# Patient Record
Sex: Male | Born: 1985 | Race: White | Hispanic: No | Marital: Single | Smoking: Never smoker
Health system: Southern US, Community
[De-identification: ages and names within clinical notes are randomized; demographics above are authoritative.]

## PROBLEM LIST (undated history)

## (undated) DIAGNOSIS — F988 Other specified behavioral and emotional disorders with onset usually occurring in childhood and adolescence: Secondary | ICD-10-CM

## (undated) DIAGNOSIS — E785 Hyperlipidemia, unspecified: Secondary | ICD-10-CM

## (undated) DIAGNOSIS — R030 Elevated blood-pressure reading, without diagnosis of hypertension: Secondary | ICD-10-CM

## (undated) DIAGNOSIS — Z148 Genetic carrier of other disease: Secondary | ICD-10-CM

## (undated) DIAGNOSIS — F419 Anxiety disorder, unspecified: Secondary | ICD-10-CM

## (undated) HISTORY — DX: Genetic carrier of other disease: Z14.8

## (undated) HISTORY — DX: Other specified behavioral and emotional disorders with onset usually occurring in childhood and adolescence: F98.8

## (undated) HISTORY — DX: Elevated blood-pressure reading, without diagnosis of hypertension: R03.0

## (undated) HISTORY — DX: Hyperlipidemia, unspecified: E78.5

## (undated) HISTORY — DX: Anxiety disorder, unspecified: F41.9

---

## 2008-06-15 ENCOUNTER — Emergency Department: Payer: Self-pay | Admitting: Emergency Medicine
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
## 2009-06-14 ENCOUNTER — Ambulatory Visit: Payer: Self-pay | Admitting: Internal Medicine

## 2019-06-01 ENCOUNTER — Ambulatory Visit: Payer: Self-pay | Admitting: Internal Medicine

## 2019-07-26 DIAGNOSIS — Z20828 Contact with and (suspected) exposure to other viral communicable diseases: Secondary | ICD-10-CM | POA: Diagnosis not present

## 2019-07-27 ENCOUNTER — Telehealth: Payer: Self-pay | Admitting: Internal Medicine

## 2019-07-27 NOTE — Telephone Encounter (Signed)
Spoke with Anthony Mora.  Said his sister's boyfried tested positive for covid-19.  He was around his sister.  Anthony Mora went to [facility] yesterday for covid-19 test.  Anthony Mora was & still is asymptomatic.   He thought he was seeing results in his [employer], but after talking with him, he was only seeing where the test was done. Got him to sign-up for [facility] mychart & link it to his [facility] account.  Michela Pitcher he will keep checking for results & will call us back when he get his results.  AMD

## 2019-07-27 NOTE — Telephone Encounter (Signed)
I think it is probably ok for him to return Thursday, but I would recommend he call the health department and get guidance as well and have them confirm ok to return and not a need to quarantine for longer time given the exposure.

## 2019-07-27 NOTE — Telephone Encounter (Signed)
Spoke with  Anthony Mora & gave him the [facility] Covid phone number [phone]) to get their guidance & input.  Then call us back tomorrow & let us know what they say.  Anthony Mora called them the [facility] & they will still there.  Nurse advised him to stay out of work until 08/05/2019.  AMD

## 2019-07-27 NOTE — Telephone Encounter (Signed)
His test results came back today Neg. He needs to know what his next step will be. No one called him he saw his results on mychart. His next work day is Thursday.

## 2019-07-27 NOTE — Telephone Encounter (Signed)
Gene called back stating his covid-19 results came up & it's "not detected".  States he's not having symptoms.  Contact was with his sister & she's had a covid test & hers is negative.  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

## 2019-07-28 NOTE — Telephone Encounter (Signed)
Contacted Anthony Mora & advised him to let HR know he's out of work until 08/05/2019.  AMD

## 2019-07-28 NOTE — Telephone Encounter (Signed)
Please complete paperwork for this with return to work 9/10 and have him call and let HR know as well to keep them informed.  If he has any symptoms arise, needs to follow-up by phone as well.

## 2019-08-25 ENCOUNTER — Ambulatory Visit: Payer: Self-pay

## 2019-08-25 DIAGNOSIS — Z23 Encounter for immunization: Secondary | ICD-10-CM

## 2019-09-09 ENCOUNTER — Other Ambulatory Visit: Payer: Self-pay

## 2019-09-09 ENCOUNTER — Ambulatory Visit: Payer: Self-pay

## 2019-09-09 DIAGNOSIS — Z0000 Encounter for general adult medical examination without abnormal findings: Secondary | ICD-10-CM

## 2019-09-10 LAB — CMP12+LP+TP+TSH+6AC+PSA+CBC?
ALT: 18 IU/L (ref 0–44)
AST: 20 IU/L (ref 0–40)
Albumin/Globulin Ratio: 1.9 (ref 1.2–2.2)
BUN/Creatinine Ratio: 10 (ref 9–20)
Calcium: 9.4 mg/dL (ref 8.7–10.2)
Chloride: 101 mmol/L (ref 96–106)
Chol/HDL Ratio: 5.1 ratio — ABNORMAL HIGH (ref 0.0–5.0)
Free Thyroxine Index: 1.8 (ref 1.2–4.9)
Hemoglobin: 17.2 g/dL (ref 13.0–17.7)
Immature Grans (Abs): 0 10*3/uL (ref 0.0–0.1)
Iron: 91 ug/dL (ref 38–169)
Lymphs: 28 %
MCH: 33 pg (ref 26.6–33.0)
MCV: 95 fL (ref 79–97)
Monocytes Absolute: 0.6 10*3/uL (ref 0.1–0.9)
Platelets: 307 10*3/uL (ref 150–450)
Potassium: 4.6 mmol/L (ref 3.5–5.2)
Prostate Specific Ag, Serum: 1.1 ng/mL (ref 0.0–4.0)
RBC: 5.22 x10E6/uL (ref 4.14–5.80)
RDW: 11.8 % (ref 11.6–15.4)
Sodium: 139 mmol/L (ref 134–144)
Triglycerides: 167 mg/dL — ABNORMAL HIGH (ref 0–149)
WBC: 7.7 10*3/uL (ref 3.4–10.8)

## 2019-09-10 LAB — POCT URINALYSIS DIPSTICK
Bilirubin, UA: NEGATIVE
Blood, UA: NEGATIVE
Glucose, UA: NEGATIVE
Ketones, UA: NEGATIVE
Leukocytes, UA: NEGATIVE
Nitrite, UA: NEGATIVE
Protein, UA: NEGATIVE
Spec Grav, UA: 1.02 (ref 1.010–1.025)
Urobilinogen, UA: 0.2 E.U./dL
pH, UA: 6 (ref 5.0–8.0)

## 2019-09-10 LAB — CMP12+LP+TP+TSH+6AC+PSA+CBC…
Albumin: 4.7 g/dL (ref 4.1–5.2)
Alkaline Phosphatase: 94 IU/L (ref 39–117)
BUN: 10 mg/dL (ref 6–20)
Basophils Absolute: 0 10*3/uL (ref 0.0–0.2)
Basos: 1 %
Bilirubin Total: 0.3 mg/dL (ref 0.0–1.2)
Cholesterol, Total: 215 mg/dL — ABNORMAL HIGH (ref 100–199)
Creatinine, Ser: 1.05 mg/dL (ref 0.76–1.27)
EOS (ABSOLUTE): 0.1 10*3/uL (ref 0.0–0.4)
Eos: 1 %
Estimated CHD Risk: 1.1 times avg. — ABNORMAL HIGH (ref 0.0–1.0)
GFR calc Af Amer: 110 mL/min/{1.73_m2} (ref >59)
GFR calc non Af Amer: 95 mL/min/{1.73_m2} (ref >59)
GGT: 20 IU/L (ref 0–65)
Globulin, Total: 2.5 g/dL (ref 1.5–4.5)
Glucose: 77 mg/dL (ref 65–99)
HDL: 42 mg/dL (ref >39)
Hematocrit: 49.5 % (ref 37.5–51.0)
Immature Granulocytes: 0 %
LDH: 150 IU/L (ref 121–224)
LDL Chol Calc (NIH): 143 mg/dL — ABNORMAL HIGH (ref 0–99)
Lymphocytes Absolute: 2.1 10*3/uL (ref 0.7–3.1)
MCHC: 34.7 g/dL (ref 31.5–35.7)
Monocytes: 8 %
Neutrophils Absolute: 4.8 10*3/uL (ref 1.4–7.0)
Neutrophils: 62 %
Phosphorus: 3.3 mg/dL (ref 2.8–4.1)
T3 Uptake Ratio: 27 % (ref 24–39)
T4, Total: 6.6 ug/dL (ref 4.5–12.0)
TSH: 1.22 u[IU]/mL (ref 0.450–4.500)
Total Protein: 7.2 g/dL (ref 6.0–8.5)
Uric Acid: 6.3 mg/dL (ref 3.7–8.6)
VLDL Cholesterol Cal: 30 mg/dL (ref 5–40)

## 2019-09-21 ENCOUNTER — Ambulatory Visit: Payer: Self-pay | Admitting: Occupational Medicine

## 2019-09-21 ENCOUNTER — Encounter: Payer: Self-pay | Admitting: Occupational Medicine

## 2019-09-21 ENCOUNTER — Other Ambulatory Visit: Payer: Self-pay

## 2019-09-21 VITALS — BP 120/90 | HR 94 | Temp 98.1°F | Resp 12 | Ht 71.0 in | Wt 162.0 lb

## 2019-09-21 DIAGNOSIS — Z0000 Encounter for general adult medical examination without abnormal findings: Secondary | ICD-10-CM

## 2019-11-29 DIAGNOSIS — Z20822 Contact with and (suspected) exposure to covid-19: Secondary | ICD-10-CM | POA: Diagnosis not present

## 2019-11-29 DIAGNOSIS — J209 Acute bronchitis, unspecified: Secondary | ICD-10-CM | POA: Diagnosis not present

## 2019-11-29 DIAGNOSIS — Z03818 Encounter for observation for suspected exposure to other biological agents ruled out: Secondary | ICD-10-CM | POA: Diagnosis not present

## 2019-11-29 DIAGNOSIS — J019 Acute sinusitis, unspecified: Secondary | ICD-10-CM | POA: Diagnosis not present

## 2019-11-30 ENCOUNTER — Ambulatory Visit: Payer: Managed Care, Other (non HMO) | Attending: Internal Medicine

## 2019-11-30 DIAGNOSIS — Z20822 Contact with and (suspected) exposure to covid-19: Secondary | ICD-10-CM

## 2019-12-01 LAB — NOVEL CORONAVIRUS, NAA: SARS-CoV-2, NAA: NOT DETECTED

## 2020-07-28 ENCOUNTER — Other Ambulatory Visit: Payer: Self-pay

## 2020-07-28 DIAGNOSIS — Z1152 Encounter for screening for COVID-19: Secondary | ICD-10-CM

## 2020-07-28 NOTE — Progress Notes (Signed)
Presents to [facility] clinic for outdoor collection of specimen for covid testing.  S/Sx started 07/27/20: Headache, head congestion,nauseated & fatigue - feels like can't move & didn't sleep well last night.  Fully vaccinated.  Has Mychart  AMD

## 2020-07-30 LAB — NOVEL CORONAVIRUS, NAA: SARS-CoV-2, NAA: NOT DETECTED

## 2020-10-03 NOTE — Progress Notes (Signed)
Scheduled to complete physical 10/17/20 with Kerrie Buffalo, NP.  AMD

## 2020-10-04 ENCOUNTER — Other Ambulatory Visit: Payer: Self-pay

## 2020-10-04 ENCOUNTER — Ambulatory Visit: Payer: Self-pay

## 2020-10-04 DIAGNOSIS — Z0000 Encounter for general adult medical examination without abnormal findings: Secondary | ICD-10-CM

## 2020-10-04 LAB — POCT URINALYSIS DIPSTICK
Bilirubin, UA: NEGATIVE
Blood, UA: NEGATIVE
Glucose, UA: NEGATIVE
Ketones, UA: NEGATIVE
Leukocytes, UA: NEGATIVE
Nitrite, UA: NEGATIVE
Protein, UA: NEGATIVE
Spec Grav, UA: 1.01 (ref 1.010–1.025)
Urobilinogen, UA: 0.2 E.U./dL
pH, UA: 6 (ref 5.0–8.0)

## 2020-10-05 LAB — CMP12+LP+TP+TSH+6AC+CBC/D/PLT
ALT: 18 IU/L (ref 0–44)
AST: 23 IU/L (ref 0–40)
Albumin/Globulin Ratio: 2.4 — ABNORMAL HIGH (ref 1.2–2.2)
Albumin: 5 g/dL (ref 4.0–5.0)
Alkaline Phosphatase: 80 IU/L (ref 44–121)
BUN/Creatinine Ratio: 12 (ref 9–20)
BUN: 12 mg/dL (ref 6–20)
Basophils Absolute: 0 10*3/uL (ref 0.0–0.2)
Basos: 1 %
Bilirubin Total: 0.9 mg/dL (ref 0.0–1.2)
Calcium: 9.5 mg/dL (ref 8.7–10.2)
Chloride: 99 mmol/L (ref 96–106)
Chol/HDL Ratio: 5.9 ratio — ABNORMAL HIGH (ref 0.0–5.0)
Cholesterol, Total: 223 mg/dL — ABNORMAL HIGH (ref 100–199)
Creatinine, Ser: 0.99 mg/dL (ref 0.76–1.27)
EOS (ABSOLUTE): 0.1 10*3/uL (ref 0.0–0.4)
Eos: 2 %
Estimated CHD Risk: 1.2 times avg. — ABNORMAL HIGH (ref 0.0–1.0)
Free Thyroxine Index: 1.8 (ref 1.2–4.9)
GFR calc Af Amer: 117 mL/min/{1.73_m2} (ref >59)
GFR calc non Af Amer: 101 mL/min/{1.73_m2} (ref >59)
GGT: 23 IU/L (ref 0–65)
Globulin, Total: 2.1 g/dL (ref 1.5–4.5)
Glucose: 91 mg/dL (ref 65–99)
HDL: 38 mg/dL — ABNORMAL LOW (ref >39)
Hematocrit: 46.8 % (ref 37.5–51.0)
Hemoglobin: 16.4 g/dL (ref 13.0–17.7)
Immature Grans (Abs): 0 10*3/uL (ref 0.0–0.1)
Immature Granulocytes: 0 %
Iron: 320 ug/dL (ref 38–169)
LDH: 150 IU/L (ref 121–224)
LDL Chol Calc (NIH): 153 mg/dL — ABNORMAL HIGH (ref 0–99)
Lymphocytes Absolute: 2.3 10*3/uL (ref 0.7–3.1)
Lymphs: 33 %
MCH: 32 pg (ref 26.6–33.0)
MCHC: 35 g/dL (ref 31.5–35.7)
MCV: 91 fL (ref 79–97)
Monocytes Absolute: 0.7 10*3/uL (ref 0.1–0.9)
Monocytes: 9 %
Neutrophils Absolute: 3.9 10*3/uL (ref 1.4–7.0)
Neutrophils: 55 %
Phosphorus: 3.4 mg/dL (ref 2.8–4.1)
Platelets: 300 10*3/uL (ref 150–450)
Potassium: 4.3 mmol/L (ref 3.5–5.2)
RBC: 5.12 x10E6/uL (ref 4.14–5.80)
RDW: 11.9 % (ref 11.6–15.4)
Sodium: 138 mmol/L (ref 134–144)
T3 Uptake Ratio: 28 % (ref 24–39)
T4, Total: 6.6 ug/dL (ref 4.5–12.0)
TSH: 1.13 u[IU]/mL (ref 0.450–4.500)
Total Protein: 7.1 g/dL (ref 6.0–8.5)
Triglycerides: 177 mg/dL — ABNORMAL HIGH (ref 0–149)
Uric Acid: 5.8 mg/dL (ref 3.8–8.4)
VLDL Cholesterol Cal: 32 mg/dL (ref 5–40)
WBC: 7.1 10*3/uL (ref 3.4–10.8)

## 2020-10-06 LAB — QUANTIFERON-TB GOLD PLUS
QuantiFERON Mitogen Value: >10 IU/mL
QuantiFERON Nil Value: 0.05 IU/mL
QuantiFERON TB1 Ag Value: 0.04 IU/mL
QuantiFERON TB2 Ag Value: 0.04 IU/mL
QuantiFERON-TB Gold Plus: NEGATIVE

## 2020-10-17 ENCOUNTER — Ambulatory Visit: Payer: Self-pay | Admitting: Nurse Practitioner

## 2020-10-17 ENCOUNTER — Other Ambulatory Visit: Payer: Self-pay

## 2020-10-17 ENCOUNTER — Encounter: Payer: Self-pay | Admitting: Nurse Practitioner

## 2020-10-17 VITALS — BP 134/90 | HR 88 | Temp 96.9°F | Resp 12 | Ht 71.0 in | Wt 163.0 lb

## 2020-10-17 DIAGNOSIS — E785 Hyperlipidemia, unspecified: Secondary | ICD-10-CM

## 2020-10-17 DIAGNOSIS — Z23 Encounter for immunization: Secondary | ICD-10-CM

## 2020-10-17 NOTE — Progress Notes (Signed)
Subjective:     Patient ID: Anthony Mora, male   DOB: 10/24/1986, 34 y.o.   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 He denies any problems or concerns today.   Past Medical History:  Diagnosis Date  . ADD (attention deficit disorder)   . Anxiety   . Dyslipidemia   . Elevated blood pressure reading   . Hemochromatosis carrier    History reviewed. No pertinent surgical history. No current outpatient medications on file prior to visit.   No current facility-administered medications on file prior to visit.   No Known Allergies   Review of Systems     Objective: BP 134/90 (BP Location: Left Arm, Patient Position: Sitting, Cuff Size: Large)   Pulse 88   Temp (!) 96.9 F (36.1 C) (Oral)   Resp 12   Ht 5\' 11"  (1.803 m)   Wt 163 lb (73.9 kg)   SpO2 98%   BMI 22.73 kg/m     Physical Exam Vitals and nursing note reviewed.  Constitutional:      General: He is not in acute distress.    Appearance: Normal appearance.  HENT:     Head: Normocephalic.     Jaw: There is normal jaw occlusion.     Right Ear: Tympanic membrane, ear canal and external ear normal.     Left Ear: Tympanic membrane, ear canal and external ear normal.     Nose: Nose normal.     Mouth/Throat:     Mouth: Mucous membranes are moist.     Dentition: Normal dentition.     Pharynx: Oropharynx is clear.  Eyes:     General: Lids are normal.     Extraocular Movements: Extraocular movements intact.     Conjunctiva/sclera: Conjunctivae normal.     Pupils: Pupils are equal, round, and reactive to light.  Neck:     Thyroid: No thyroid mass or thyroid tenderness.     Vascular: Normal carotid pulses. No carotid bruit or JVD.     Trachea: Trachea normal.  Cardiovascular:     Rate and Rhythm: Normal rate and regular rhythm.     Heart sounds: No murmur heard.   Pulmonary:     Effort: Pulmonary effort is normal.     Breath  sounds: Normal breath sounds and air entry.  Abdominal:     General: Abdomen is flat. Bowel sounds are normal.     Palpations: Abdomen is soft.     Tenderness: There is no abdominal tenderness. There is no right CVA tenderness or left CVA tenderness.  Musculoskeletal:        General: No swelling or tenderness. Normal range of motion.     Cervical back: Normal range of motion. No pain with movement, spinous process tenderness or muscular tenderness.     Right lower leg: No edema.     Left lower leg: No edema.  Lymphadenopathy:     Cervical: No cervical adenopathy.  Skin:    General: Skin is warm and dry.  Neurological:     Mental Status: He is alert.     Cranial Nerves: Cranial nerves are intact.     Sensory: Sensation is intact.     Motor: No weakness, tremor, atrophy or pronator drift.     Coordination: Romberg sign negative. Coordination normal. Finger-Nose-Finger Test and Heel to [facility] Test normal.     Gait: Gait is intact.  Deep Tendon Reflexes:     Reflex Scores:      Bicep reflexes are 2+ on the right side and 2+ on the left side.      Brachioradialis reflexes are 2+ on the right side and 2+ on the left side.      Patellar reflexes are 2+ on the right side and 2+ on the left side.    Comments: Ambulatory with steady gait, stands on one foot without difficulty. Straight leg raises without pain. Distal pulses 2+. Grips are equal, radial pulses 2+. Rapid alternating movements without difficulty.   Psychiatric:        Mood and Affect: Mood normal.        Behavior: Behavior normal.        Thought Content: Thought content normal.        Judgment: Judgment normal.       Assessment:  1. Need for Td vaccine - Tdap vaccine greater than or equal to 7yo IM  2. Hyperlipidemia, unspecified hyperlipidemia type     Plan:    Discussed with the patient clinical and lab findings and need for healthy diet and exercise to try and improve elevated cholesterol and triglycerides. Patient  given opportunity to ask questions and all questioned fully answered. He will return in one year or sooner if any problems arise. Also discussed BP and return if it rises.

## 2020-10-17 NOTE — Patient Instructions (Addendum)
Your Cholesterol and and Triglycerides are elevated today. It is important that you develop healthy eating habits and exercise program to prevent having to start medication or having an adverse event. You blood pressure is also border line. Please check it weekly to be sure it is not going up. If you are having any problems please let us know.   Health Maintenance, Male Adopting a healthy lifestyle and getting preventive care are important in promoting health and wellness. Ask your health care provider about:  The right schedule for you to have regular tests and exams.  Things you can do on your own to prevent diseases and keep yourself healthy. What should I know about diet, weight, and exercise? Eat a healthy diet   Eat a diet that includes plenty of vegetables, fruits, low-fat dairy products, and lean protein.  Do not eat a lot of foods that are high in solid fats, added sugars, or sodium. Maintain a healthy weight Body mass index (BMI) is a measurement that can be used to identify possible weight problems. It estimates body fat based on height and weight. Your health care provider can help determine your BMI and help you achieve or maintain a healthy weight. Get regular exercise Get regular exercise. This is one of the most important things you can do for your health. Most adults should:  Exercise for at least 150 minutes each week. The exercise should increase your heart rate and make you sweat (moderate-intensity exercise).  Do strengthening exercises at least twice a week. This is in addition to the moderate-intensity exercise.  Spend less time sitting. Even light physical activity can be beneficial. Watch cholesterol and blood lipids Have your blood tested for lipids and cholesterol at 32 years of age, then have this test every 5 years. You may need to have your cholesterol levels checked more often if:  Your lipid or cholesterol levels are high.  You are older than 32 years of  age.  You are at high risk for heart disease. What should I know about cancer screening? Many types of cancers can be detected early and may often be prevented. Depending on your health history and family history, you may need to have cancer screening at various ages. This may include screening for:  Colorectal cancer.  Prostate cancer.  Skin cancer.  Lung cancer. What should I know about heart disease, diabetes, and high blood pressure? Blood pressure and heart disease  High blood pressure causes heart disease and increases the risk of stroke. This is more likely to develop in people who have high blood pressure readings, are of African descent, or are overweight.  Talk with your health care provider about your target blood pressure readings.  Have your blood pressure checked: ? Every 3-5 years if you are 32-63 years of age. ? Every year if you are 32 years old or older.  If you are between the ages of 32 and 43 and are a current or former smoker, ask your health care provider if you should have a one-time screening for abdominal aortic aneurysm (AAA). Diabetes Have regular diabetes screenings. This checks your fasting blood sugar level. Have the screening done:  Once every three years after age 32 if you are at a normal weight and have a low risk for diabetes.  More often and at a younger age if you are overweight or have a high risk for diabetes. What should I know about preventing infection? Hepatitis B If you have a higher risk  for hepatitis B, you should be screened for this virus. Talk with your health care provider to find out if you are at risk for hepatitis B infection. Hepatitis C Blood testing is recommended for:  Everyone born from [birth year] through 1963.  Anyone with known risk factors for hepatitis C. Sexually transmitted infections (STIs)  You should be screened each year for STIs, including gonorrhea and chlamydia, if: ? You are sexually active and are younger  than 32 years of age. ? You are older than 32 years of age and your health care provider tells you that you are at risk for this type of infection. ? Your sexual activity has changed since you were last screened, and you are at increased risk for chlamydia or gonorrhea. Ask your health care provider if you are at risk.  Ask your health care provider about whether you are at high risk for HIV. Your health care provider may recommend a prescription medicine to help prevent HIV infection. If you choose to take medicine to prevent HIV, you should first get tested for HIV. You should then be tested every 3 months for as long as you are taking the medicine. Follow these instructions at home: Lifestyle  Do not use any products that contain nicotine or tobacco, such as cigarettes, e-cigarettes, and chewing tobacco. If you need help quitting, ask your health care provider.  Do not use street drugs.  Do not share needles.  Ask your health care provider for help if you need support or information about quitting drugs. Alcohol use  Do not drink alcohol if your health care provider tells you not to drink.  If you drink alcohol: ? Limit how much you have to 0-2 drinks a day. ? Be aware of how much alcohol is in your drink. In the [place] one drink equals one 12 oz bottle of beer (355 mL), one 5 oz glass of wine (148 mL), or one 1 oz glass of hard liquor (44 mL). General instructions  Schedule regular health, dental, and eye exams.  Stay current with your vaccines.  Tell your health care provider if: ? You often feel depressed. ? You have ever been abused or do not feel safe at home. Summary  Adopting a healthy lifestyle and getting preventive care are important in promoting health and wellness.  Follow your health care provider's instructions about healthy diet, exercising, and getting tested or screened for diseases.  Follow your health care provider's instructions on monitoring your  cholesterol and blood pressure. This information is not intended to replace advice given to you by your health care provider. Make sure you discuss any questions you have with your health care provider. Document Revised: 11/04/2018 Document Reviewed: 11/04/2018 Elsevier Patient Education  2020 [employer].   Fat and Cholesterol Restricted Eating Plan Eating a diet that limits fat and cholesterol may help lower your risk for heart disease and other conditions. Your body needs fat and cholesterol for basic functions, but eating too much of these things can be harmful to your health. Your health care provider may order lab tests to check your blood fat (lipid) and cholesterol levels. This helps your health care provider understand your risk for certain conditions and whether you need to make diet changes. Work with your health care provider or dietitian to make an eating plan that is right for you. Your plan includes:  Limit your fat intake to ______% or less of your total calories a day.  Limit your saturated fat  intake to ______% or less of your total calories a day.  Limit the amount of cholesterol in your diet to less than _________mg a day.  Eat ___________ g of fiber a day. What are tips for following this plan? General guidelines   If you are overweight, work with your health care provider to lose weight safely. Losing just 5-10% of your body weight can improve your overall health and help prevent diseases such as diabetes and heart disease.  Avoid: ? Foods with added sugar. ? Fried foods. ? Foods that contain partially hydrogenated oils, including stick margarine, some tub margarines, cookies, crackers, and other baked goods.  Limit alcohol intake to no more than 1 drink a day for nonpregnant women and 2 drinks a day for men. One drink equals 12 oz of beer, 5 oz of wine, or 1 oz of hard liquor. Reading food labels  Check food labels for: ? Trans fats, partially hydrogenated  oils, or high amounts of saturated fat. Avoid foods that contain saturated fat and trans fat. ? The amount of cholesterol in each serving. Try to eat no more than 200 mg of cholesterol each day. ? The amount of fiber in each serving. Try to eat at least 20-30 g of fiber each day.  Choose foods with healthy fats, such as: ? Monounsaturated and polyunsaturated fats. These include olive and canola oil, flaxseeds, walnuts, almonds, and seeds. ? Omega-3 fats. These are found in foods such as salmon, mackerel, sardines, tuna, flaxseed oil, and ground flaxseeds.  Choose grain products that have whole grains. Look for the word "whole" as the first word in the ingredient list. Cooking  Cook foods using methods other than frying. Baking, boiling, grilling, and broiling are some healthy options.  Eat more home-cooked food and less restaurant, buffet, and fast food.  Avoid cooking using saturated fats. ? Animal sources of saturated fats include meats, butter, and cream. ? Plant sources of saturated fats include palm oil, palm kernel oil, and coconut oil. Meal planning   At meals, imagine dividing your plate into fourths: ? Fill one-half of your plate with vegetables and green salads. ? Fill one-fourth of your plate with whole grains. ? Fill one-fourth of your plate with lean protein foods.  Eat fish that is high in omega-3 fats at least two times a week.  Eat more foods that contain fiber, such as whole grains, beans, apples, broccoli, carrots, peas, and barley. These foods help promote healthy cholesterol levels in the blood. Recommended foods Grains  Whole grains, such as whole wheat or whole grain breads, crackers, cereals, and pasta. Unsweetened oatmeal, bulgur, barley, quinoa, or brown rice. Corn or whole wheat flour tortillas. Vegetables  Fresh or frozen vegetables (raw, steamed, roasted, or grilled). Green salads. Fruits  All fresh, canned (in natural juice), or frozen fruits. Meats  and other protein foods  Ground beef (85% or leaner), grass-fed beef, or beef trimmed of fat. Skinless chicken or [place]. Ground chicken or [place]. Pork trimmed of fat. All fish and seafood. Egg whites. Dried beans, peas, or lentils. Unsalted nuts or seeds. Unsalted canned beans. Natural nut butters without added sugar and oil. Dairy  Low-fat or nonfat dairy products, such as skim or 1% milk, 2% or reduced-fat cheeses, low-fat and fat-free ricotta or cottage cheese, or plain low-fat and nonfat yogurt. Fats and oils  Tub margarine without trans fats. Light or reduced-fat mayonnaise and salad dressings. Avocado. Olive, canola, sesame, or safflower oils. The items listed above may  not be a complete list of recommended foods or beverages. Contact your dietitian for more options. Foods to avoid Grains  White bread. White pasta. White rice. Cornbread. Bagels, pastries, and croissants. Crackers and snack foods that contain trans fat and hydrogenated oils. Vegetables  Vegetables cooked in cheese, cream, or butter sauce. Fried vegetables. Fruits  Canned fruit in heavy syrup. Fruit in cream or butter sauce. Fried fruit. Meats and other protein foods  Fatty cuts of meat. Ribs, chicken wings, bacon, sausage, bologna, salami, chitterlings, fatback, hot dogs, bratwurst, and packaged lunch meats. Liver and organ meats. Whole eggs and egg yolks. Chicken and [place] with skin. Fried meat. Dairy  Whole or 2% milk, cream, half-and-half, and cream cheese. Whole milk cheeses. Whole-fat or sweetened yogurt. Full-fat cheeses. Nondairy creamers and whipped toppings. Processed cheese, cheese spreads, and cheese curds. Beverages  Alcohol. Sugar-sweetened drinks such as sodas, lemonade, and fruit drinks. Fats and oils  Butter, stick margarine, lard, shortening, ghee, or bacon fat. Coconut, palm kernel, and palm oils. Sweets and desserts  Corn syrup, sugars, honey, and molasses. Candy. Jam and jelly. Syrup.  Sweetened cereals. Cookies, pies, cakes, donuts, muffins, and ice cream. The items listed above may not be a complete list of foods and beverages to avoid. Contact your dietitian for more information. Summary  Your body needs fat and cholesterol for basic functions. However, eating too much of these things can be harmful to your health.  Work with your health care provider and dietitian to follow a diet low in fat and cholesterol. Doing this may help lower your risk for heart disease and other conditions.  Choose healthy fats, such as monounsaturated and polyunsaturated fats, and foods high in omega-3 fatty acids.  Eat fiber-rich foods, such as whole grains, beans, peas, fruits, and vegetables.  Limit or avoid alcohol, fried foods, and foods high in saturated fats, partially hydrogenated oils, and sugar. This information is not intended to replace advice given to you by your health care provider. Make sure you discuss any questions you have with your health care provider. Document Revised: 10/24/2017 Document Reviewed: 07/29/2017 Elsevier Patient Education  2020 [employer].  American Heart Association [facility]) Exercise Recommendation  Being physically active is important to prevent heart disease and stroke, the nation's No. 1and No. 5killers. To improve overall cardiovascular health, we suggest at least 150 minutes per week of moderate exercise or 75 minutes per week of vigorous exercise (or a combination of moderate and vigorous activity). Thirty minutes a day, five times a week is an easy goal to remember. You will also experience benefits even if you divide your time into two or three segments of 10 to 15 minutes per day.  For people who would benefit from lowering their blood pressure or cholesterol, we recommend 40 minutes of aerobic exercise of moderate to vigorous intensity three to four times a week to lower the risk for heart attack and stroke.  Physical activity is anything that  makes you move your body and burn calories.  This includes things like climbing stairs or playing sports. Aerobic exercises benefit your heart, and include walking, jogging, swimming or biking. Strength and stretching exercises are best for overall stamina and flexibility.  The simplest, positive change you can make to effectively improve your heart health is to start walking. It's enjoyable, free, easy, social and great exercise. A walking program is flexible and boasts high success rates because people can stick with it. It's easy for walking to become a regular  and satisfying part of life.   For Overall Cardiovascular Health:  At least 30 minutes of moderate-intensity aerobic activity at least 5 days per week for a total of 150  OR   At least 25 minutes of vigorous aerobic activity at least 3 days per week for a total of 75 minutes; or a combination of moderate- and vigorous-intensity aerobic activity  AND   Moderate- to high-intensity muscle-strengthening activity at least 2 days per week for additional health benefits.  For Lowering Blood Pressure and Cholesterol  An average 40 minutes of moderate- to vigorous-intensity aerobic activity 3 or 4 times per week  What if I can't make it to the time goal? Something is always better than nothing! And everyone has to start somewhere. Even if you've been sedentary for years, today is the day you can begin to make healthy changes in your life. If you don't think you'll make it for 30 or 40 minutes, set a reachable goal for today. You can work up toward your overall goal by increasing your time as you get stronger. Don't let all-or-nothing thinking rob you of doing what you can every day.  Source:http://www.heart.org

## 2021-07-12 ENCOUNTER — Other Ambulatory Visit: Payer: Self-pay

## 2021-07-12 ENCOUNTER — Ambulatory Visit: Payer: Self-pay

## 2021-07-12 VITALS — BP 134/92

## 2021-07-12 DIAGNOSIS — Z0130 Encounter for examination of blood pressure without abnormal findings: Secondary | ICD-10-CM

## 2021-07-12 NOTE — Progress Notes (Signed)
Pt presents today for BP check. CL,RMA 

## 2021-10-24 ENCOUNTER — Other Ambulatory Visit: Payer: Self-pay

## 2021-10-24 DIAGNOSIS — R0981 Nasal congestion: Secondary | ICD-10-CM

## 2021-10-24 NOTE — Progress Notes (Signed)
Patient presents with report of new onset of headache and nasal congestion that started last night. Rapid flu neg, Rapid COVID positive. Work note provided. Gene will call clinic if his symptoms should progress or unrelieved with his current interventions of over the  counter cold medication. He verbalized good understanding to increase fluids, rest and hold off on workout related supplements.

## 2021-11-02 DIAGNOSIS — S0181XA Laceration without foreign body of other part of head, initial encounter: Secondary | ICD-10-CM | POA: Diagnosis not present

## 2021-11-02 DIAGNOSIS — S0531XA Ocular laceration without prolapse or loss of intraocular tissue, right eye, initial encounter: Secondary | ICD-10-CM | POA: Diagnosis not present

## 2021-11-07 ENCOUNTER — Ambulatory Visit: Payer: 59 | Admitting: Physician Assistant

## 2021-11-12 ENCOUNTER — Other Ambulatory Visit: Payer: Self-pay

## 2021-11-12 ENCOUNTER — Encounter: Payer: Self-pay | Admitting: Physician Assistant

## 2021-11-12 ENCOUNTER — Ambulatory Visit: Payer: Self-pay | Admitting: Physician Assistant
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
## 2021-11-14 ENCOUNTER — Encounter: Payer: Self-pay | Admitting: Physician Assistant

## 2021-11-14 ENCOUNTER — Ambulatory Visit: Payer: Self-pay | Admitting: Physician Assistant

## 2021-11-14 ENCOUNTER — Encounter: Payer: Self-pay | Admitting: Emergency Medicine

## 2021-11-14 ENCOUNTER — Emergency Department: Payer: 59

## 2021-11-14 ENCOUNTER — Emergency Department
Admission: EM | Admit: 2021-11-14 | Discharge: 2021-11-14 | Disposition: A | Discharge status: Home / Self Care | Payer: 59 | Attending: Emergency Medicine | Admitting: Emergency Medicine

## 2021-11-14 ENCOUNTER — Other Ambulatory Visit: Payer: Self-pay

## 2021-11-14 DIAGNOSIS — M542 Cervicalgia: Secondary | ICD-10-CM | POA: Diagnosis not present

## 2021-11-14 DIAGNOSIS — S161XXD Strain of muscle, fascia and tendon at neck level, subsequent encounter: Secondary | ICD-10-CM

## 2021-11-14 DIAGNOSIS — R519 Headache, unspecified: Secondary | ICD-10-CM | POA: Insufficient documentation

## 2021-11-14 DIAGNOSIS — E237 Disorder of pituitary gland, unspecified: Secondary | ICD-10-CM | POA: Diagnosis not present

## 2021-11-14 DIAGNOSIS — S0990XA Unspecified injury of head, initial encounter: Secondary | ICD-10-CM | POA: Diagnosis not present

## 2021-11-14 DIAGNOSIS — G9389 Other specified disorders of brain: Secondary | ICD-10-CM | POA: Diagnosis not present

## 2021-11-14 DIAGNOSIS — S199XXA Unspecified injury of neck, initial encounter: Secondary | ICD-10-CM | POA: Diagnosis not present

## 2021-11-14 LAB — CBC WITH DIFFERENTIAL/PLATELET
Abs Immature Granulocytes: 0.05 10*3/uL (ref 0.00–0.07)
Basophils Absolute: 0 10*3/uL (ref 0.0–0.1)
Basophils Relative: 0 %
Eosinophils Absolute: 0.1 10*3/uL (ref 0.0–0.5)
Eosinophils Relative: 1 %
HCT: 47.1 % (ref 39.0–52.0)
Hemoglobin: 17.2 g/dL — ABNORMAL HIGH (ref 13.0–17.0)
Immature Granulocytes: 1 %
Lymphocytes Relative: 28 %
Lymphs Abs: 2.4 10*3/uL (ref 0.7–4.0)
MCH: 33.4 pg (ref 26.0–34.0)
MCHC: 36.5 g/dL — ABNORMAL HIGH (ref 30.0–36.0)
MCV: 91.5 fL (ref 80.0–100.0)
Monocytes Absolute: 0.6 10*3/uL (ref 0.1–1.0)
Monocytes Relative: 7 %
Neutro Abs: 5.7 10*3/uL (ref 1.7–7.7)
Neutrophils Relative %: 63 %
Platelets: 360 10*3/uL (ref 150–400)
RBC: 5.15 MIL/uL (ref 4.22–5.81)
RDW: 11.3 % — ABNORMAL LOW (ref 11.5–15.5)
WBC: 8.9 10*3/uL (ref 4.0–10.5)
nRBC: 0.2 % (ref 0.0–0.2)

## 2021-11-14 LAB — BASIC METABOLIC PANEL
Anion gap: 8 (ref 5–15)
BUN: 8 mg/dL (ref 6–20)
CO2: 28 mmol/L (ref 22–32)
Calcium: 9.3 mg/dL (ref 8.9–10.3)
Chloride: 102 mmol/L (ref 98–111)
Creatinine, Ser: 0.92 mg/dL (ref 0.61–1.24)
GFR, Estimated: >60 mL/min (ref >60)
Glucose, Bld: 100 mg/dL — ABNORMAL HIGH (ref 70–99)
Potassium: 4.4 mmol/L (ref 3.5–5.1)
Sodium: 138 mmol/L (ref 135–145)

## 2021-11-14 IMAGING — MR MR HEAD WO/W CM
11 of 19 series · 22 of 48 positions shown · IV contrast (gadavist)
Comparison: CT from earlier the same day.

CLINICAL DATA: Initial evaluation for possible pituitary
abnormality.

EXAM:
MRI HEAD WITHOUT AND WITH CONTRAST
MRA HEAD WITHOUT CONTRAST
TECHNIQUE: Multiplanar, multi-echo pulse sequences of the brain and surrounding
structures were acquired without and with intravenous contrast. A
pituitary protocol was utilized. Angiographic images of the Circle
of Willis were acquired using MRA technique without intravenous
contrast.
CONTRAST:  7mL GADAVIST GADOBUTROL 1 MMOL/ML IV SOLN

[Series 8: T2 · axial · 5.0mm · 0.53mm/px · 1 of 25 slices shown]
[im 1/25]
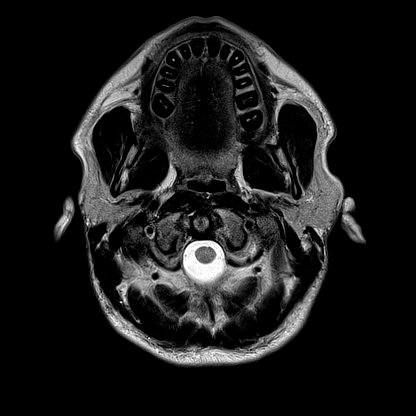

[Series 10: FLAIR · axial · 3.0mm · 0.53mm/px · z∈[-105,+44]mm · 5 of 55 slices shown]
[im 1/55]
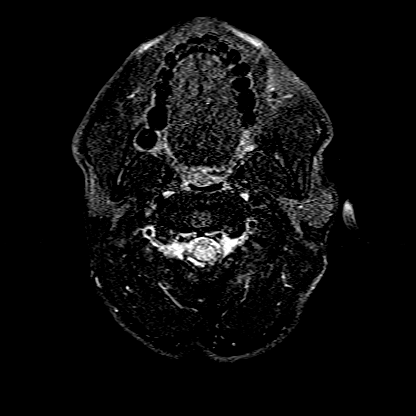
[im 14/55]
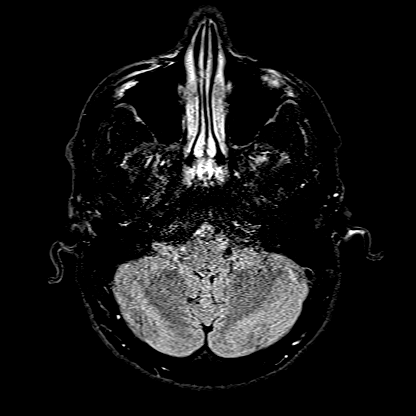
[im 28/55]
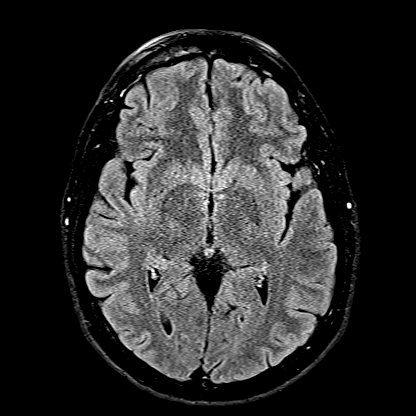
[im 41/55]
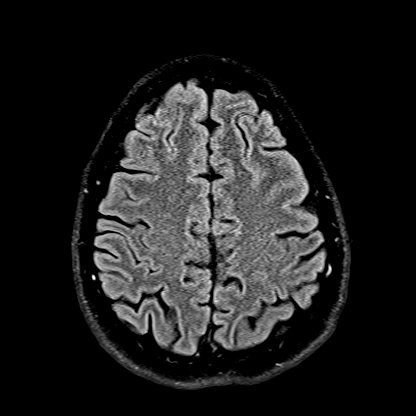
[im 55/55]
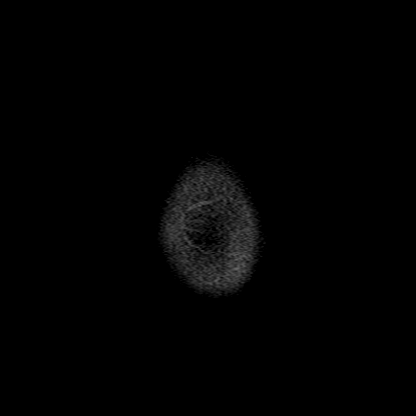

[Series 16: T1 post-contrast · coronal · 3.0mm · 0.28mm/px · 1 of 11 slices shown (1 of 9)]
[im 1/11]
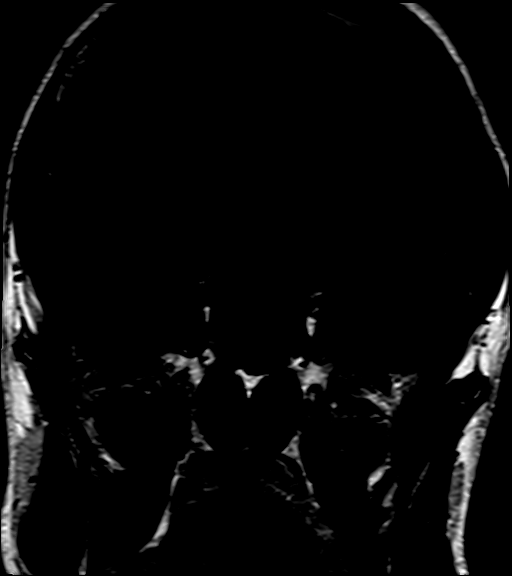

[Series 17: T1 post-contrast · coronal · 3.0mm · 0.28mm/px · 1 of 11 slices shown (2 of 9)]
[im 1/11]
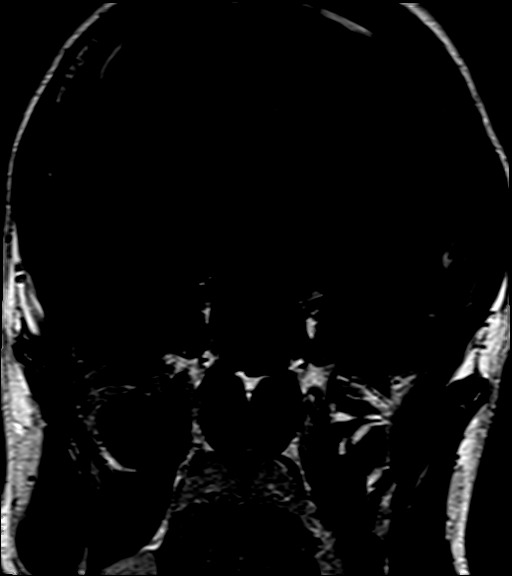

[Series 18: T1 post-contrast · coronal · 3.0mm · 0.28mm/px · 1 of 11 slices shown (3 of 9)]
[im 1/11]
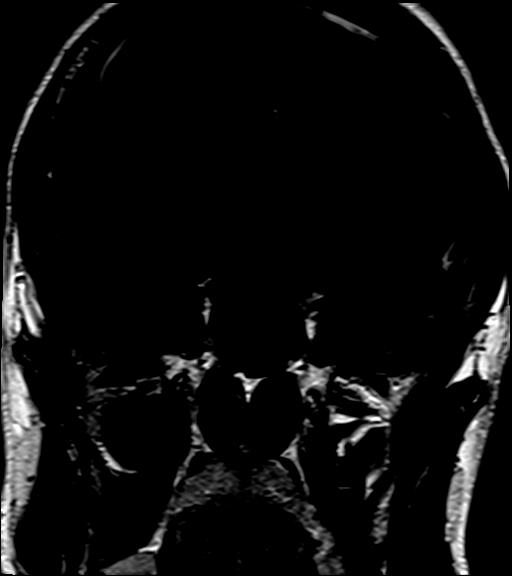

[Series 19: T1 post-contrast · coronal · 3.0mm · 0.28mm/px · 1 of 11 slices shown (4 of 9)]
[im 1/11]
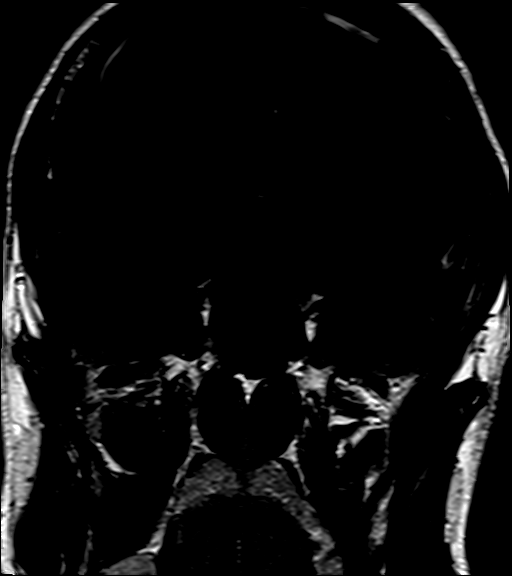

[Series 20: T1 post-contrast · coronal · 3.0mm · 0.28mm/px · 1 of 11 slices shown (5 of 9)]
[im 1/11]
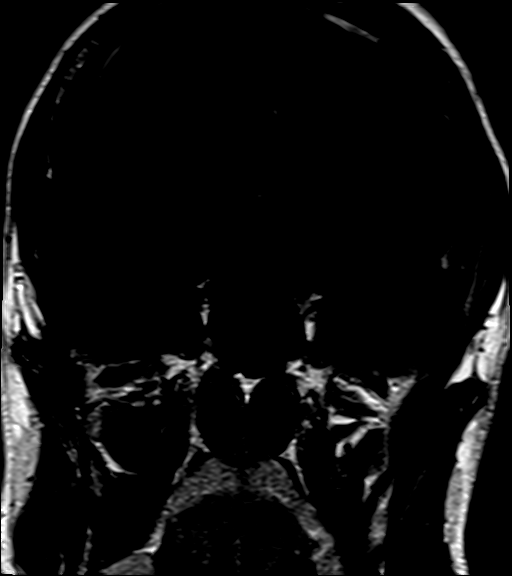

[Series 21: T1 post-contrast · coronal · 3.0mm · 0.28mm/px · 1 of 11 slices shown (6 of 9)]
[im 1/11]
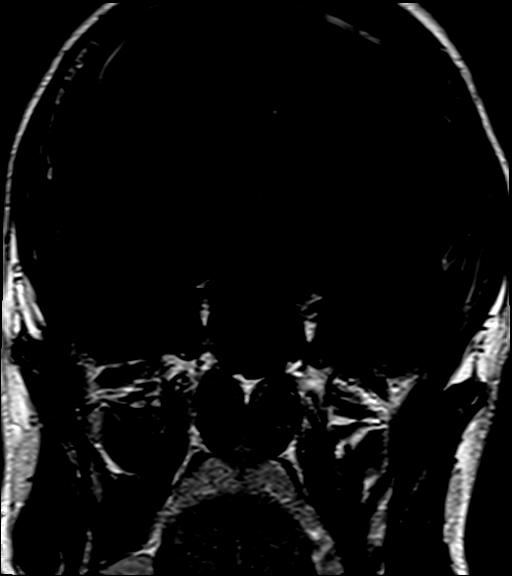

[Series 22: T1 post-contrast · coronal · 3.0mm · 0.21mm/px · 1 of 13 slices shown (7 of 9)]
[im 1/13]
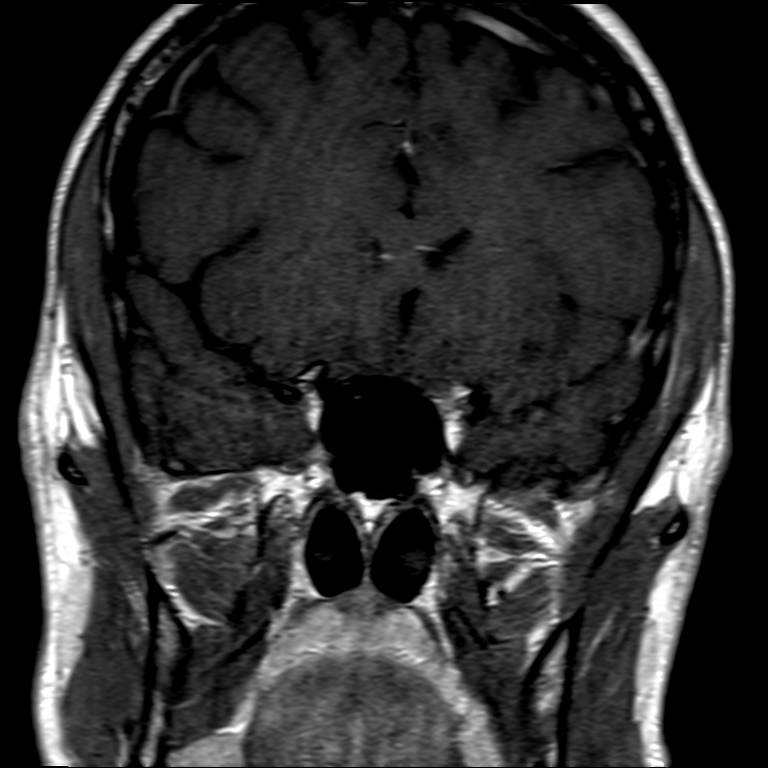

[Series 23: T1 post-contrast · sagittal · 3.0mm · 0.21mm/px · 1 of 13 slices shown (8 of 9)]
[im 1/13]
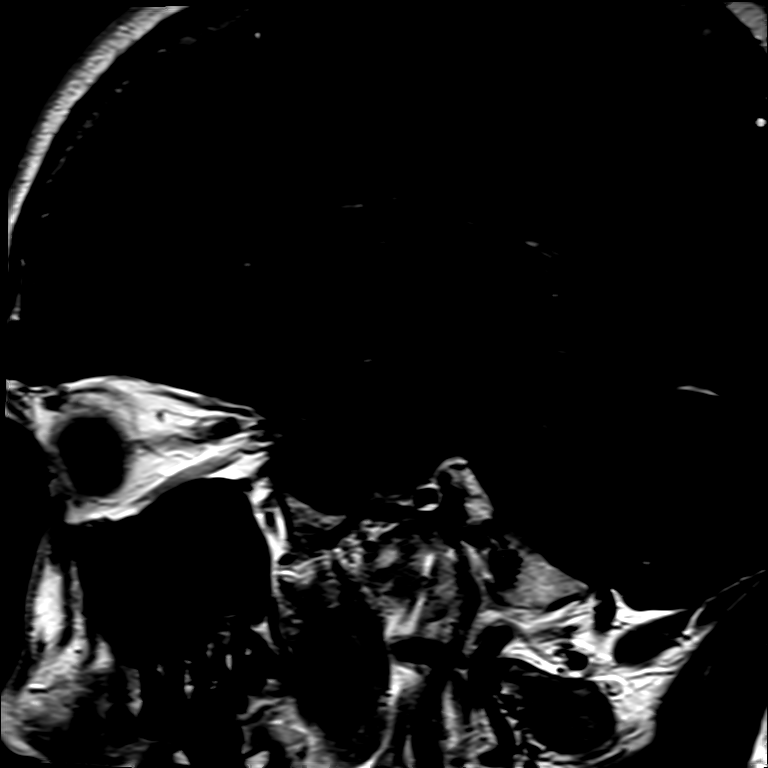

[Series 24: T1 post-contrast · axial · 1.0mm · 0.98mm/px · z∈[-110,+36]mm · 8 of 160 slices shown (9 of 9)]
[im 1/160]
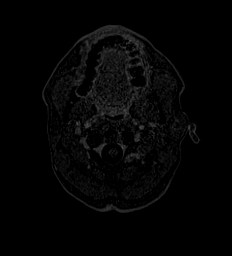
[im 27/160]
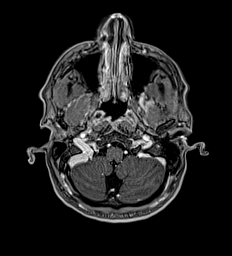
[im 54/160]
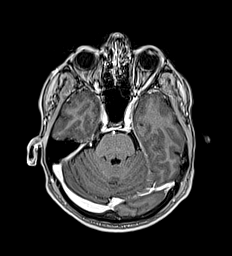
[im 67/160]
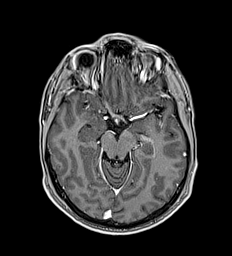
[im 93/160]
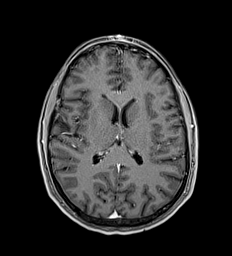
[im 107/160]
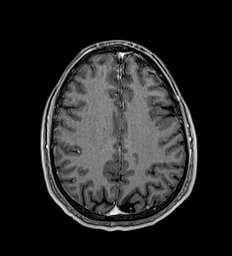
[im 133/160]
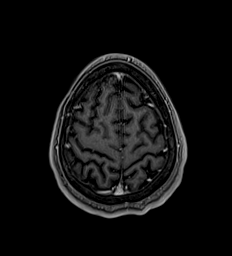
[im 160/160]
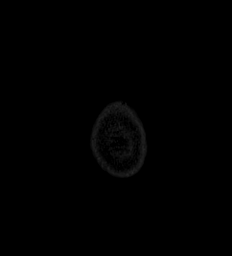

[22 of 48 positions shown; findings below may reference images not displayed]

FINDINGS: MRI HEAD FINDINGS

Brain: Cerebral volume within normal limits for age. No focal
parenchymal signal abnormality within the brain itself. No evidence
for acute or subacute infarct. Gray-white matter differentiation
maintained. No encephalomalacia to suggest chronic cortical
infarction or other insult. No foci of susceptibility artifact to
suggest acute or chronic intracranial hemorrhage.

No mass lesion within the brain itself. No mass effect or midline
shift. No hydrocephalus or extra-axial fluid collection. No abnormal
enhancement within the brain.

Thin section postcontrast dynamic imaging through the sella and
pituitary gland was performed. Prior to contrast administration, a
nodular lesion measuring 8 x 8 x 7 mm is seen within the left aspect
of the gland (series 13, image 9). Slight suprasellar extension into
the adjacent suprasellar cistern. Pituitary stalk is slightly
deviated to the right. Minimal mass effect on the optic chiasm
superiorly (series 17, image 8). No extension into the adjacent
cavernous sinus. Following contrast administration, this area
demonstrates relative hypoenhancement as compared to surrounding
normal pituitary gland. Given the intrinsic precontrast T1
hyperintense signal, difficult to be certain whether this lesion
actually enhances. Given these findings, primary differential
considerations include a pituitary microadenoma containing internal
hemorrhage or possibly a proteinaceous/hemorrhagic Rathke's cleft
cyst.

Vascular: Major intracranial vascular flow voids are well
maintained.

Skull and upper cervical spine: Craniocervical junction within
normal limits. Bone marrow signal intensity normal. No scalp soft
tissue abnormality.

Sinuses/Orbits: Globes and orbital soft tissues within normal
limits. Paranasal sinuses are clear. No mastoid effusion. Inner ear
structures grossly normal.

Other: None.

MRA HEAD FINDINGS

Anterior circulation: Visualized distal cervical segments of the
internal carotid arteries are widely patent with antegrade flow.
Petrous, cavernous, and supraclinoid segments patent without
stenosis or other abnormality. A1 segments widely patent. Normal
anterior communicating artery complex. Anterior cerebral arteries
patent without stenosis. No M1 stenosis or occlusion. Normal MCA
bifurcations. Distal MCA branches well perfused and symmetric.

Posterior circulation: Vertebral arteries largely code dominant and
widely patent to the vertebrobasilar junction. Both PICA origins
patent and normal. Basilar widely patent to its distal aspect
without stenosis. Superior cerebellar arteries patent bilaterally.
Both PCAs primarily supplied via the basilar well perfused or distal
aspects.

Anatomic variants: None significant.  No aneurysm.
IMPRESSION: 1. 8 x 8 x 7 mm nodular lesion within the left aspect of the
pituitary gland with slight suprasellar extension. Finding is
somewhat indeterminate, with primary differential considerations
including a pituitary microadenoma containing internal hemorrhage or
possibly a proteinaceous/hemorrhagic Rathke's cleft cyst.
Correlation with laboratory values and pituitary function tests with
outpatient endocrinology referral suggested.
2. Otherwise normal brain MRI.
3. Normal intracranial MRA.  No intracranial aneurysm.
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
## 2021-11-14 IMAGING — MR MR MRA HEAD W/O CM
1 series · 17 of 48 positions shown · IV contrast (gadavist)
Comparison: CT from earlier the same day.

CLINICAL DATA: Initial evaluation for possible pituitary
abnormality.

EXAM:
MRI HEAD WITHOUT AND WITH CONTRAST
MRA HEAD WITHOUT CONTRAST
TECHNIQUE: Multiplanar, multi-echo pulse sequences of the brain and surrounding
structures were acquired without and with intravenous contrast. A
pituitary protocol was utilized. Angiographic images of the Circle
of Willis were acquired using MRA technique without intravenous
contrast.
CONTRAST:  7mL GADAVIST GADOBUTROL 1 MMOL/ML IV SOLN

[Series 1: TOF · axial · 0.5mm · 0.41mm/px · z∈[-84,+7]mm · 17 of 205 slices shown]
[im 1/205]
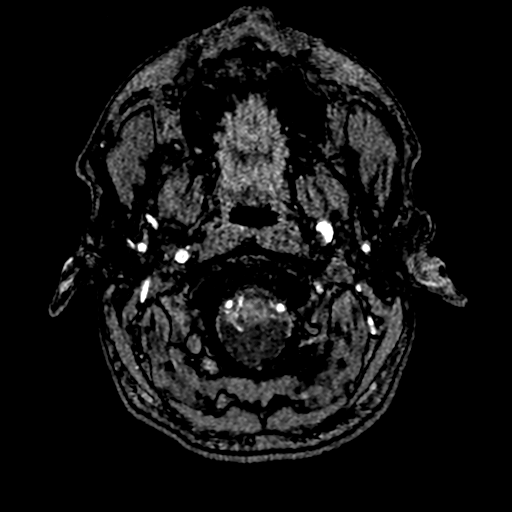
[im 5/205]
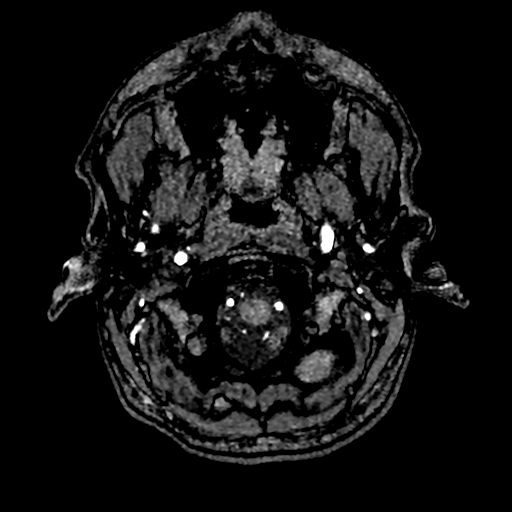
[im 9/205]
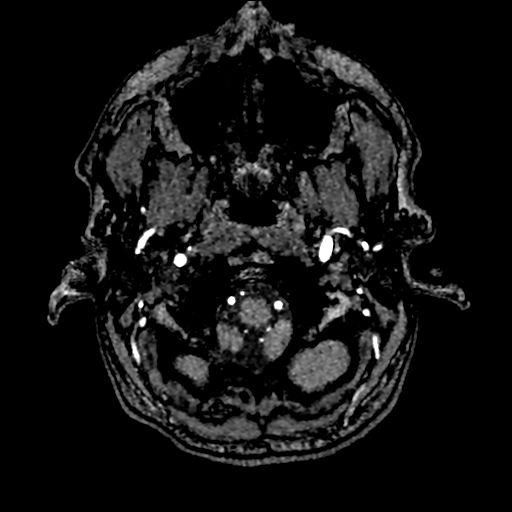
[im 14/205]
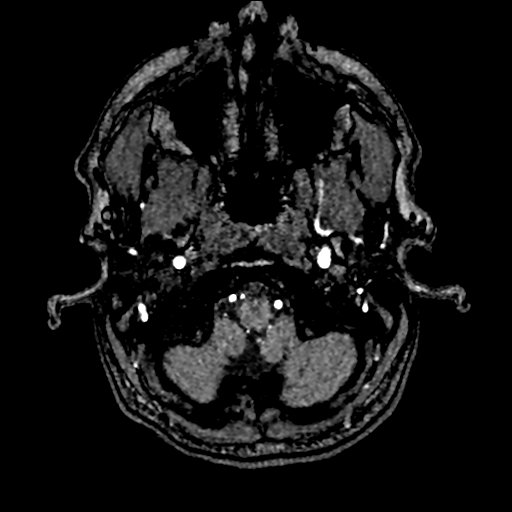
[im 18/205]
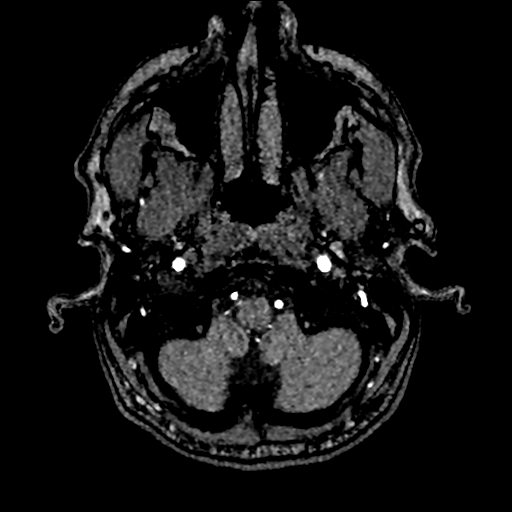
[im 22/205]
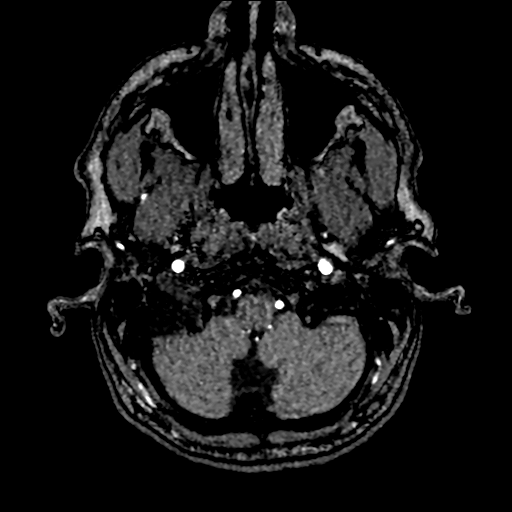
[im 27/205]
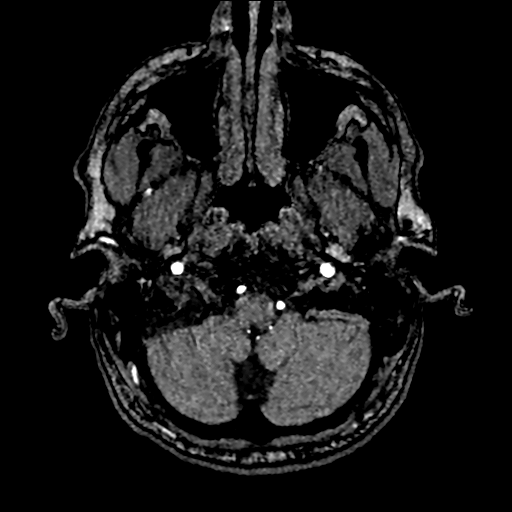
[im 35/205]
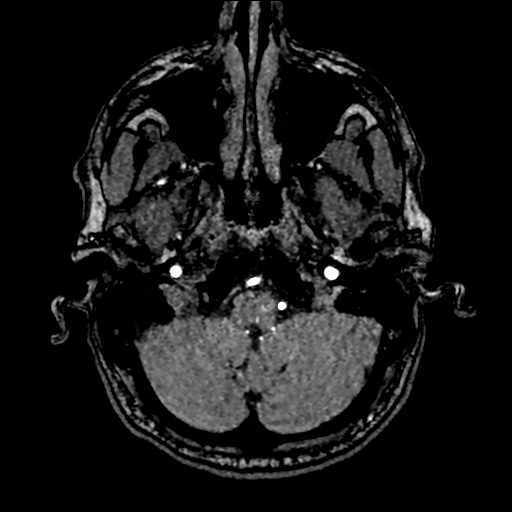
[im 40/205]
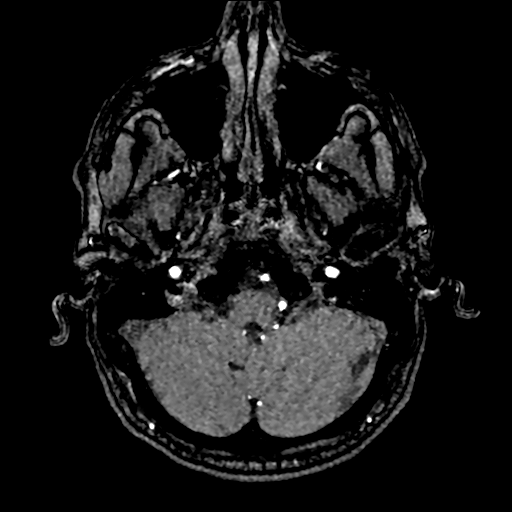
[im 66/205]
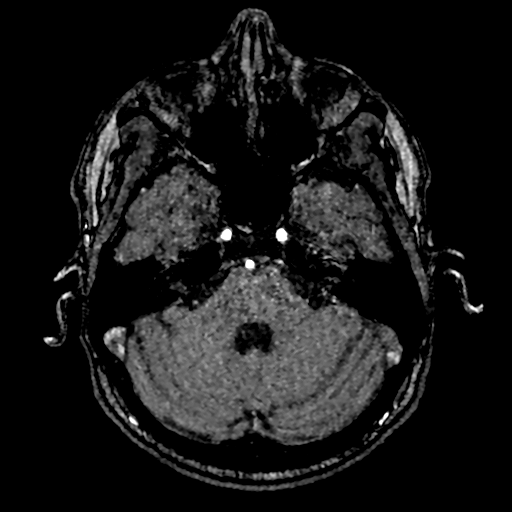
[im 92/205]
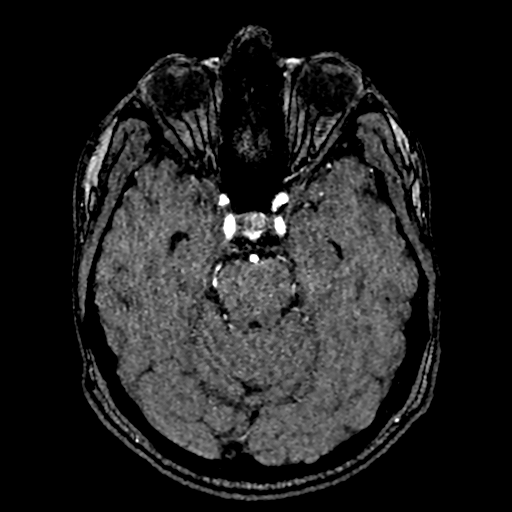
[im 105/205]
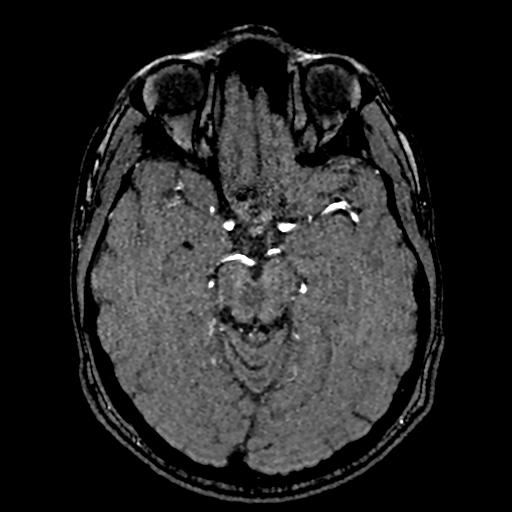
[im 118/205]
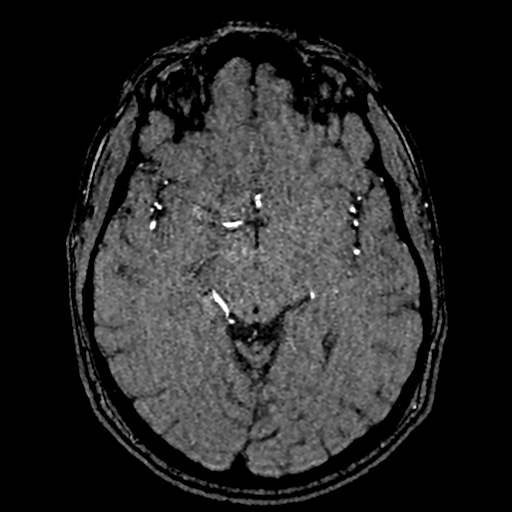
[im 144/205]
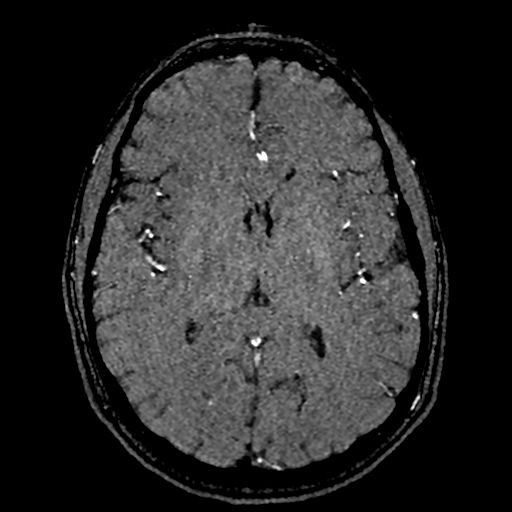
[im 170/205]
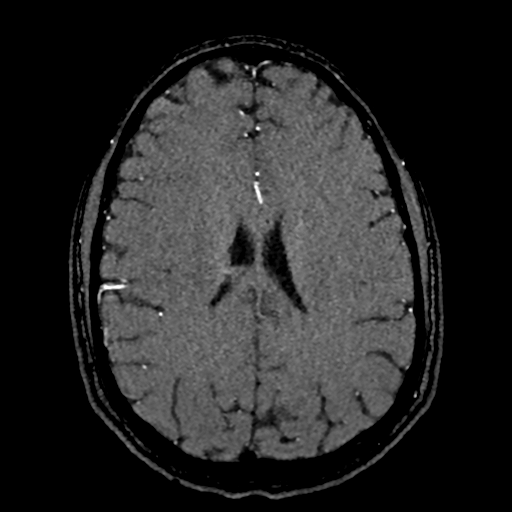
[im 174/205]
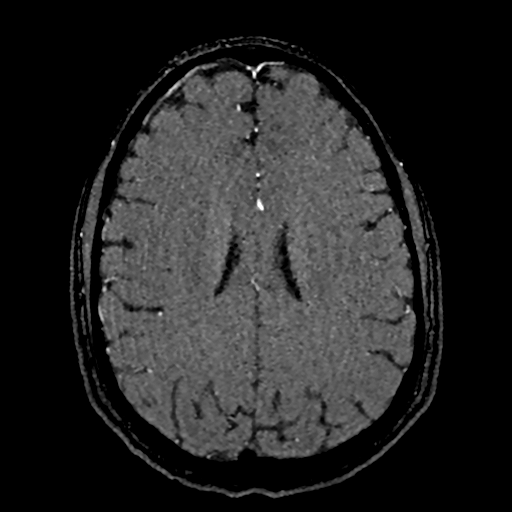
[im 196/205]
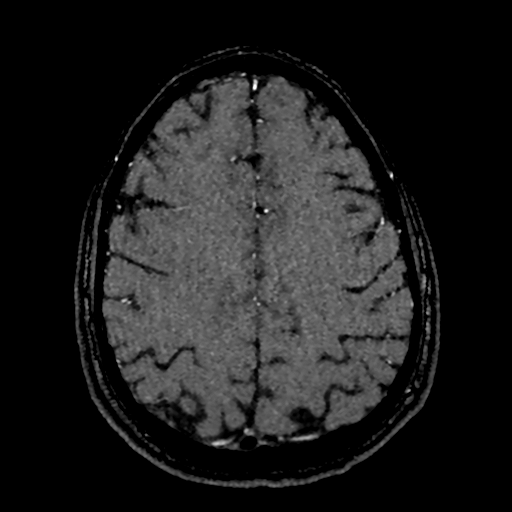

[17 of 48 positions shown; findings below may reference images not displayed]

FINDINGS: MRI HEAD FINDINGS

Brain: Cerebral volume within normal limits for age. No focal
parenchymal signal abnormality within the brain itself. No evidence
for acute or subacute infarct. Gray-white matter differentiation
maintained. No encephalomalacia to suggest chronic cortical
infarction or other insult. No foci of susceptibility artifact to
suggest acute or chronic intracranial hemorrhage.

No mass lesion within the brain itself. No mass effect or midline
shift. No hydrocephalus or extra-axial fluid collection. No abnormal
enhancement within the brain.

Thin section postcontrast dynamic imaging through the sella and
pituitary gland was performed. Prior to contrast administration, a
nodular lesion measuring 8 x 8 x 7 mm is seen within the left aspect
of the gland (series 13, image 9). Slight suprasellar extension into
the adjacent suprasellar cistern. Pituitary stalk is slightly
deviated to the right. Minimal mass effect on the optic chiasm
superiorly (series 17, image 8). No extension into the adjacent
cavernous sinus. Following contrast administration, this area
demonstrates relative hypoenhancement as compared to surrounding
normal pituitary gland. Given the intrinsic precontrast T1
hyperintense signal, difficult to be certain whether this lesion
actually enhances. Given these findings, primary differential
considerations include a pituitary microadenoma containing internal
hemorrhage or possibly a proteinaceous/hemorrhagic Rathke's cleft
cyst.

Vascular: Major intracranial vascular flow voids are well
maintained.

Skull and upper cervical spine: Craniocervical junction within
normal limits. Bone marrow signal intensity normal. No scalp soft
tissue abnormality.

Sinuses/Orbits: Globes and orbital soft tissues within normal
limits. Paranasal sinuses are clear. No mastoid effusion. Inner ear
structures grossly normal.

Other: None.

MRA HEAD FINDINGS

Anterior circulation: Visualized distal cervical segments of the
internal carotid arteries are widely patent with antegrade flow.
Petrous, cavernous, and supraclinoid segments patent without
stenosis or other abnormality. A1 segments widely patent. Normal
anterior communicating artery complex. Anterior cerebral arteries
patent without stenosis. No M1 stenosis or occlusion. Normal MCA
bifurcations. Distal MCA branches well perfused and symmetric.

Posterior circulation: Vertebral arteries largely code dominant and
widely patent to the vertebrobasilar junction. Both PICA origins
patent and normal. Basilar widely patent to its distal aspect
without stenosis. Superior cerebellar arteries patent bilaterally.
Both PCAs primarily supplied via the basilar well perfused or distal
aspects.

Anatomic variants: None significant.  No aneurysm.
IMPRESSION: 1. 8 x 8 x 7 mm nodular lesion within the left aspect of the
pituitary gland with slight suprasellar extension. Finding is
somewhat indeterminate, with primary differential considerations
including a pituitary microadenoma containing internal hemorrhage or
possibly a proteinaceous/hemorrhagic Rathke's cleft cyst.
Correlation with laboratory values and pituitary function tests with
outpatient endocrinology referral suggested.
2. Otherwise normal brain MRI.
3. Normal intracranial MRA.  No intracranial aneurysm.
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
## 2021-11-14 MED ORDER — GADOBUTROL 1 MMOL/ML IV SOLN
7.0000 mL | Freq: Once | INTRAVENOUS | Status: AC | PRN
  Administered 2021-11-14: 21:00:00 7 mL via INTRAVENOUS
  Filled 2021-11-14: qty 7.5

## 2021-11-14 MED ORDER — LORAZEPAM 2 MG/ML IJ SOLN
2.0000 mg | Freq: Once | INTRAMUSCULAR | Status: AC
  Administered 2021-11-14: 21:00:00 2 mg via INTRAVENOUS
  Filled 2021-11-14: qty 1

## 2021-11-14 MED ORDER — METOCLOPRAMIDE HCL 10 MG PO TABS
10.0000 mg | ORAL_TABLET | Freq: Once | ORAL | Status: AC
  Administered 2021-11-14: 16:00:00 10 mg via ORAL
  Filled 2021-11-14: qty 1

## 2021-11-14 MED ORDER — ACETAMINOPHEN 325 MG PO TABS
650.0000 mg | ORAL_TABLET | Freq: Once | ORAL | Status: AC
  Administered 2021-11-14: 16:00:00 650 mg via ORAL
  Filled 2021-11-14: qty 2

## 2021-11-14 MED ORDER — LORAZEPAM 1 MG PO TABS: 1.0000 mg | ORAL_TABLET | ORAL | Status: DC
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
## 2021-11-14 NOTE — ED Provider Notes (Signed)
----------------------------------------- °  10:05 PM on 11/14/2021 ----------------------------------------- The MRI report is not in the computer however it is attached to the reading.  If not in the pictures of the MRI like the previous time that this happened.  I have attached a copy of the reading below:  IMPRESSION: 1. 8 x 8 x 7 mm nodular lesion within the left aspect of the pituitary gland with slight suprasellar extension. Finding is somewhat indeterminate, with primary differential considerations including a pituitary microadenoma containing internal hemorrhage or possibly a proteinaceous/hemorrhagic Rathke's cleft cyst. Correlation with laboratory values and pituitary function tests with outpatient endocrinology referral suggested. 2. Otherwise normal brain MRI. 3. Normal intracranial MRA. No intracranial aneurysm.   Discussed this in detail with the patient recommended he follow-up with his doctor.  He has a primary care doctor with the city of [place].  I told him to return here for any problems like headache nausea vomiting double vision or any other strange or unusual sensations.   Arnaldo Natal, MD 11/14/21 2211

## 2021-11-14 NOTE — ED Notes (Signed)
Ativan is to give prior to MRI.
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
## 2021-11-21 ENCOUNTER — Other Ambulatory Visit: Payer: Self-pay

## 2021-11-21 ENCOUNTER — Encounter: Payer: Self-pay | Admitting: Physician Assistant

## 2021-11-21 ENCOUNTER — Ambulatory Visit: Payer: 59 | Admitting: Physician Assistant

## 2021-11-21 DIAGNOSIS — E236 Other disorders of pituitary gland: Secondary | ICD-10-CM

## 2021-11-21 DIAGNOSIS — S161XXD Strain of muscle, fascia and tendon at neck level, subsequent encounter: Secondary | ICD-10-CM
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
## 2021-11-27 ENCOUNTER — Other Ambulatory Visit: Payer: Self-pay

## 2021-11-27 DIAGNOSIS — Z1152 Encounter for screening for COVID-19: Secondary | ICD-10-CM

## 2021-11-27 LAB — POCT INFLUENZA A/B
Influenza A, POC: NEGATIVE
Influenza B, POC: NEGATIVE

## 2021-11-27 LAB — POC COVID19 BINAXNOW: SARS Coronavirus 2 Ag: NEGATIVE

## 2021-11-27 NOTE — Progress Notes (Signed)
Pt presents today for rapid covid and flu test.

## 2021-11-28 ENCOUNTER — Telehealth: Payer: Self-pay

## 2021-11-28 NOTE — Telephone Encounter (Signed)
Received a fax from [facility] stating - "Due to extremely limited availability, [facility] will be unable to accept new patients at this time.  Please redirect future endocrinology referrals to a different office.  We apologize for the inconvenience."  Researched [facility] & found  Dr. Baldwin Crown who specializes in treatment of pituitary disorders.  Faxed referral to [phone]  Notified Anthony Mora that [facility] will be contacting him to schedule an appt & to be on the look out for a call from a [phone] phone number.  AMD

## 2021-11-28 NOTE — Addendum Note (Signed)
Addended by: Gardner Candle on: 11/28/2021 11:41 AM   Modules accepted: Orders

## 2021-11-29 ENCOUNTER — Ambulatory Visit: Payer: Self-pay | Admitting: Physician Assistant

## 2021-11-29 ENCOUNTER — Encounter: Payer: Self-pay | Admitting: Physician Assistant

## 2021-11-29 DIAGNOSIS — Z8639 Personal history of other endocrine, nutritional and metabolic disease: Secondary | ICD-10-CM

## 2021-11-29 DIAGNOSIS — E236 Other disorders of pituitary gland: Secondary | ICD-10-CM

## 2021-11-29 HISTORY — DX: Personal history of other endocrine, nutritional and metabolic disease: Z86.39
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
## 2021-12-04 ENCOUNTER — Ambulatory Visit: Payer: 59 | Admitting: Physician Assistant

## 2021-12-20 ENCOUNTER — Ambulatory Visit: Payer: Self-pay | Admitting: Physician Assistant

## 2021-12-20 ENCOUNTER — Encounter: Payer: Self-pay | Admitting: Physician Assistant

## 2021-12-20 ENCOUNTER — Other Ambulatory Visit: Payer: Self-pay

## 2021-12-20 VITALS — BP 126/94 | HR 96 | Temp 97.8°F | Resp 14 | Ht 70.0 in | Wt 160.0 lb

## 2021-12-20 DIAGNOSIS — F411 Generalized anxiety disorder: Secondary | ICD-10-CM

## 2021-12-20 MED ORDER — SERTRALINE HCL 25 MG PO TABS: 25.0000 mg | ORAL_TABLET | Freq: Every day | ORAL | 0 refills | Status: DC
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
## 2022-01-01 ENCOUNTER — Other Ambulatory Visit: Payer: Self-pay

## 2022-01-01 ENCOUNTER — Ambulatory Visit: Payer: Self-pay

## 2022-01-01 DIAGNOSIS — Z0000 Encounter for general adult medical examination without abnormal findings: Secondary | ICD-10-CM

## 2022-01-01 DIAGNOSIS — Z0289 Encounter for other administrative examinations: Secondary | ICD-10-CM

## 2022-01-01 LAB — POCT URINALYSIS DIPSTICK
Bilirubin, UA: NEGATIVE
Blood, UA: NEGATIVE
Glucose, UA: NEGATIVE
Ketones, UA: NEGATIVE
Leukocytes, UA: NEGATIVE
Nitrite, UA: NEGATIVE
Protein, UA: NEGATIVE
Spec Grav, UA: >=1.03 — AB (ref 1.010–1.025)
Urobilinogen, UA: 0.2 E.U./dL
pH, UA: 6 (ref 5.0–8.0)

## 2022-01-01 NOTE — Progress Notes (Signed)
01/10/22 annual physical scheduled.Anthony Mora

## 2022-01-02 LAB — CMP12+LP+TP+TSH+6AC+CBC/D/PLT
ALT: 21 IU/L (ref 0–44)
AST: 22 IU/L (ref 0–40)
Albumin/Globulin Ratio: 2 (ref 1.2–2.2)
Albumin: 4.9 g/dL (ref 4.0–5.0)
Alkaline Phosphatase: 82 IU/L (ref 44–121)
BUN/Creatinine Ratio: 10 (ref 9–20)
BUN: 11 mg/dL (ref 6–20)
Basophils Absolute: 0 10*3/uL (ref 0.0–0.2)
Basos: 1 %
Bilirubin Total: 0.6 mg/dL (ref 0.0–1.2)
Calcium: 9.5 mg/dL (ref 8.7–10.2)
Chloride: 101 mmol/L (ref 96–106)
Chol/HDL Ratio: 5.6 ratio — ABNORMAL HIGH (ref 0.0–5.0)
Cholesterol, Total: 223 mg/dL — ABNORMAL HIGH (ref 100–199)
Creatinine, Ser: 1.07 mg/dL (ref 0.76–1.27)
EOS (ABSOLUTE): 0.1 10*3/uL (ref 0.0–0.4)
Eos: 2 %
Estimated CHD Risk: 1.2 times avg. — ABNORMAL HIGH (ref 0.0–1.0)
Free Thyroxine Index: 1.6 (ref 1.2–4.9)
GGT: 32 IU/L (ref 0–65)
Globulin, Total: 2.4 g/dL (ref 1.5–4.5)
Glucose: 74 mg/dL (ref 70–99)
HDL: 40 mg/dL (ref >39)
Hematocrit: 50.9 % (ref 37.5–51.0)
Hemoglobin: 18.1 g/dL — ABNORMAL HIGH (ref 13.0–17.7)
Immature Grans (Abs): 0 10*3/uL (ref 0.0–0.1)
Immature Granulocytes: 0 %
Iron: 200 ug/dL — ABNORMAL HIGH (ref 38–169)
LDH: 160 IU/L (ref 121–224)
LDL Chol Calc (NIH): 151 mg/dL — ABNORMAL HIGH (ref 0–99)
Lymphocytes Absolute: 1.8 10*3/uL (ref 0.7–3.1)
Lymphs: 31 %
MCH: 32.7 pg (ref 26.6–33.0)
MCHC: 35.6 g/dL (ref 31.5–35.7)
MCV: 92 fL (ref 79–97)
Monocytes Absolute: 0.5 10*3/uL (ref 0.1–0.9)
Monocytes: 9 %
Neutrophils Absolute: 3.3 10*3/uL (ref 1.4–7.0)
Neutrophils: 57 %
Phosphorus: 3.6 mg/dL (ref 2.8–4.1)
Platelets: 331 10*3/uL (ref 150–450)
Potassium: 4.2 mmol/L (ref 3.5–5.2)
RBC: 5.54 x10E6/uL (ref 4.14–5.80)
RDW: 11.6 % (ref 11.6–15.4)
Sodium: 141 mmol/L (ref 134–144)
T3 Uptake Ratio: 25 % (ref 24–39)
T4, Total: 6.3 ug/dL (ref 4.5–12.0)
TSH: 0.898 u[IU]/mL (ref 0.450–4.500)
Total Protein: 7.3 g/dL (ref 6.0–8.5)
Triglycerides: 175 mg/dL — ABNORMAL HIGH (ref 0–149)
Uric Acid: 7.5 mg/dL (ref 3.8–8.4)
VLDL Cholesterol Cal: 32 mg/dL (ref 5–40)
WBC: 5.7 10*3/uL (ref 3.4–10.8)
eGFR: 94 mL/min/{1.73_m2} (ref >59)

## 2022-01-07 ENCOUNTER — Ambulatory Visit: Payer: 59 | Admitting: Physician Assistant

## 2022-01-07 ENCOUNTER — Other Ambulatory Visit: Payer: Self-pay | Admitting: Physician Assistant

## 2022-01-07 DIAGNOSIS — F411 Generalized anxiety disorder: Secondary | ICD-10-CM

## 2022-01-10 ENCOUNTER — Ambulatory Visit: Payer: Self-pay | Admitting: Physician Assistant

## 2022-01-10 ENCOUNTER — Other Ambulatory Visit: Payer: Self-pay

## 2022-01-10 ENCOUNTER — Encounter: Payer: Self-pay | Admitting: Physician Assistant

## 2022-01-10 VITALS — BP 140/100 | HR 80 | Temp 97.6°F | Resp 14 | Ht 70.0 in | Wt 161.0 lb

## 2022-01-10 DIAGNOSIS — L910 Hypertrophic scar: Secondary | ICD-10-CM

## 2022-01-10 DIAGNOSIS — E236 Other disorders of pituitary gland: Secondary | ICD-10-CM

## 2022-01-10 DIAGNOSIS — Z0000 Encounter for general adult medical examination without abnormal findings: Secondary | ICD-10-CM

## 2022-01-10 DIAGNOSIS — F411 Generalized anxiety disorder: Secondary | ICD-10-CM

## 2022-01-10 MED ORDER — FLURANDRENOLIDE 4 MCG/SQCM EX TAPE: 1.0000 | MEDICATED_TAPE | CUTANEOUS | 2 refills | Status: AC
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
## 2022-01-10 NOTE — Progress Notes (Signed)
[place]  ____________________________________________   None    (approximate)  I have reviewed the triage vital signs and the nursing notes.   HISTORY  Chief Complaint Annual Exam    HPI Anthony Mora is a 36 y.o. male patient presents for annual physical exam.  Patient voices no concerns or complaints.  Patient states he has decreased his Zoloft from 50 mg daily to 25 mg and is feeling better and able to cope with anxiety.        Past Medical History:  Diagnosis Date   ADD (attention deficit disorder)    Anxiety    Dyslipidemia    Elevated blood pressure reading    Hemochromatosis carrier    History of enlargement of pituitary gland 11/29/2021    Patient Active Problem List   Diagnosis Date Noted   History of enlargement of pituitary gland 11/29/2021    History reviewed. No pertinent surgical history.  Prior to Admission medications   Medication Sig Start Date End Date Taking? Authorizing Provider  sertraline (ZOLOFT) 25 MG tablet Take 1 tablet (25 mg total) by mouth daily. Take one tablet daily for one week; increase to 2 tablets 2nd week, increase to four tablets third week. 12/20/21  Yes Sable Feil, PA-C    Allergies Patient has no known allergies.  Family History  Problem Relation Age of Onset   Cancer Father     Social History Social History   Tobacco Use   Smoking status: Never   Smokeless tobacco: Current    Types: Snuff    Review of Systems  Constitutional: No fever/chills Eyes: No visual changes. ENT: No sore throat. Cardiovascular: Denies chest pain. Respiratory: Denies shortness of breath.  Elevated blood pressure Gastrointestinal: No abdominal pain.  No nausea, no vomiting.  No diarrhea.  No constipation. Genitourinary: Negative for dysuria. Musculoskeletal: Negative for back pain. Skin: Negative for rash.  Keloid to central forehead. Neurological: Negative for headaches, focal weakness or numbness. Psychiatric:  Anxiety and ADD Endocrine: Enlarged pituitary gland   ____________________________________________   PHYSICAL EXAM:  VITAL SIGNS: Temperature 97.6, pulse 80, respiration 14, BP is 140/100, patient 90% O2 sat on room air.  Patient was on 65 pounds and BMI is 26.68. Constitutional: Alert and oriented. Well appearing and in no acute distress. Eyes: Conjunctivae are normal. PERRL. EOMI. Head: Atraumatic. Nose: No congestion/rhinnorhea. Mouth/Throat: Mucous membranes are moist.  Oropharynx non-erythematous. Neck: No stridor.  No cervical spine tenderness to palpation. Hematological/Lymphatic/Immunilogical: No cervical lymphadenopathy. Cardiovascular: Normal rate, regular rhythm. Grossly normal heart sounds.  Good peripheral circulation.  Elevated blood pressure. Respiratory: Normal respiratory effort.  No retractions. Lungs CTAB. Gastrointestinal: Soft and nontender. No distention. No abdominal bruits. No CVA tenderness. Genitourinary: Deferred **}Musculoskeletal: No lower extremity tenderness nor edema.  No joint effusions. Neurologic:  Normal speech and language. No gross focal neurologic deficits are appreciated. No gait instability. Skin:  Skin is warm, dry and intact.  0.5 keloid lesion center of forehead. Psychiatric: Mood and affect are normal. Speech and behavior are normal.  ____________________________________________   LABS       Component Ref Range & Units 9 d ago 1 yr ago 2 yr ago  Color, UA  amber  yellow  Yellow   Clarity, UA  clear  clear  Clear   Glucose, UA Negative Negative  Negative  Negative   Bilirubin, UA  negative  negative  Negative   Ketones, UA  negative  negative  Negative   Spec Grav,  UA 1.010 - 1.025 >=1.030 Abnormal   1.010  1.020   Blood, UA  negative  negative  Negative   pH, UA 5.0 - 8.0 6.0  6.0  6.0   Protein, UA Negative Negative  Negative  Negative   Urobilinogen, UA 0.2 or 1.0 E.U./dL 0.2  0.2  0.2   Nitrite, UA  negative  negative  Negative    Leukocytes, UA Negative Negative  Negative  Negative   Appearance  dark  light     Odor               Specimen Collected: 01/01/22 11:05 Last Resulted: 01/01/22 11:05      Lab Flowsheet    Order Details    View Encounter    Lab and Collection Details    Routing    Result History    View Encounter Conversation        Result Care Coordination   Patient Communication   Add Comments   Seen Back to Top       Other Results from 01/01/2022   Contains abnormal data CMP12+LP+TP+TSH+6AC+CBC/D/Plt Order: [id number] Status: Final result    Visible to patient: Yes (seen)    Next appt: None    Dx: Annual physical exam    0 Result Notes         Component Ref Range & Units 9 d ago (01/01/22) 1 mo ago (11/14/21) 1 mo ago (11/14/21) 1 yr ago (10/04/20) 2 yr ago (09/09/19)  Glucose 70 - 99 mg/dL 74  100 High  CM   91 R  77 R   Uric Acid 3.8 - 8.4 mg/dL 7.5    5.8 CM  6.3 R, CM   Comment:            Therapeutic target for gout patients: <6.0  BUN 6 - 20 mg/dL 11  8   12  10    Creatinine, Ser 0.76 - 1.27 mg/dL 1.07  0.92 R   0.99  1.05   eGFR >59 mL/min/1.73 94       BUN/Creatinine Ratio 9 - 20 10    12  10    Sodium 134 - 144 mmol/L 141  138 R   138  139   Potassium 3.5 - 5.2 mmol/L 4.2  4.4 R   4.3  4.6   Chloride 96 - 106 mmol/L 101  102 R   99  101   Calcium 8.7 - 10.2 mg/dL 9.5  9.3 R   9.5  9.4   Phosphorus 2.8 - 4.1 mg/dL 3.6    3.4  3.3   Total Protein 6.0 - 8.5 g/dL 7.3    7.1  7.2   Albumin 4.0 - 5.0 g/dL 4.9    5.0  4.7 R   Globulin, Total 1.5 - 4.5 g/dL 2.4    2.1  2.5   Albumin/Globulin Ratio 1.2 - 2.2 2.0    2.4 High   1.9   Bilirubin Total 0.0 - 1.2 mg/dL 0.6    0.9  0.3   Alkaline Phosphatase 44 - 121 IU/L 82    80 CM  94 R   LDH 121 - 224 IU/L 160    150  150   AST 0 - 40 IU/L 22    23  20    ALT 0 - 44 IU/L 21    18  18    GGT 0 - 65 IU/L 32    23  20   Iron 38 - 169  ug/dL 200 High     320 High Panic   91   Cholesterol, Total 100 - 199 mg/dL 223 High     223  High   215 High    Triglycerides 0 - 149 mg/dL 175 High     177 High   167 High    HDL >39 mg/dL 40    38 Low   42   VLDL Cholesterol Cal 5 - 40 mg/dL 32    32  30   LDL Chol Calc (NIH) 0 - 99 mg/dL 151 High     153 High   143 High    Chol/HDL Ratio 0.0 - 5.0 ratio 5.6 High     5.9 High  CM  5.1 High  CM   Comment:                                   T. Chol/HDL Ratio                                              Men  Women                                1/2 Avg.Risk  3.4    3.3                                    Avg.Risk  5.0    4.4                                 2X Avg.Risk  9.6    7.1                                 3X Avg.Risk 23.4   11.0   Estimated CHD Risk 0.0 - 1.0 times avg. 1.2 High     1.2 High  CM  1.1 High  CM   Comment: The CHD Risk is based on the T. Chol/HDL ratio. Other  factors affect CHD Risk such as hypertension, smoking,  diabetes, severe obesity, and family history of  premature CHD.   TSH 0.450 - 4.500 uIU/mL 0.898    1.130  1.220   T4, Total 4.5 - 12.0 ug/dL 6.3    6.6  6.6   T3 Uptake Ratio 24 - 39 % 25    28  27    Free Thyroxine Index 1.2 - 4.9 1.6    1.8  1.8   WBC 3.4 - 10.8 x10E3/uL 5.7   8.9 R  7.1  7.7   RBC 4.14 - 5.80 x10E6/uL 5.54   5.15 R  5.12  5.22   Hemoglobin 13.0 - 17.7 g/dL 18.1 High    17.2 High  R  16.4  17.2   Hematocrit 37.5 - 51.0 % 50.9   47.1 R  46.8  49.5   MCV 79 - 97 fL 92   91.5 R  91  95   MCH 26.6 - 33.0 pg 32.7   33.4 R  32.0  33.0   MCHC  31.5 - 35.7 g/dL 35.6   36.5 High  R  35.0  34.7   RDW 11.6 - 15.4 % 11.6   11.3 Low  R  11.9  11.8   Platelets 150 - 450 x10E3/uL 331   360 R  300  307   Neutrophils Not Estab. % 57   63 R  55  62   Lymphs Not Estab. % 31    33  28   Monocytes Not Estab. % 9    9  8    Eos Not Estab. % 2    2  1    Basos Not Estab. % 1    1  1    Neutrophils Absolute 1.4 - 7.0 x10E3/uL 3.3   5.7 R  3.9  4.8   Lymphocytes Absolute 0.7 - 3.1 x10E3/uL 1.8   2.4 R  2.3  2.1   Monocytes Absolute 0.1 - 0.9 x10E3/uL  0.5    0.7  0.6   EOS (ABSOLUTE) 0.0 - 0.4 x10E3/uL 0.1    0.1  0.1   Basophils Absolute 0.0 - 0.2 x10E3/uL 0.0   0.0 R  0.0  0.0   Immature Granulocytes Not Estab. % 0   1 R  0  0   Immature Grans            ____________________________________________  EKG  Sinus  Rhythm at 84 bpm WITHIN NORMAL LIMITS  ____________________________________________  Discussed lab and EKG results with patient.  Patient has elevated iron levels at 200.  This is is an improvement from last year was elevated at 320.  Patient is a hemochromatosis carrier.  Patient also admits to using supplements with increased iron for weight training.  Discussed rationale for monitoring increase iron intake.  Rest of labs unremarkable.  ____________________________________________   INITIAL IMPRESSION / ASSESSMENT AND PLAN   As part of my medical decision making, I reviewed the following data within the [facility] previous medication.  Will monitor blood pressure monthly for 3 months.  Patient given a prescription for flurandrenolide tape keloid treatment.     ____________________________________________   FINAL CLINICAL IMPRESSION(S)   Well exam   ED Discharge Orders     None        Note:  This document was prepared using Dragon voice recognition software and may include unintentional dictation errors.

## 2022-01-30 ENCOUNTER — Other Ambulatory Visit: Payer: Self-pay

## 2022-01-30 ENCOUNTER — Other Ambulatory Visit: Payer: Self-pay | Admitting: Physician Assistant

## 2022-01-30 MED ORDER — SERTRALINE HCL 100 MG PO TABS: 100.0000 mg | ORAL_TABLET | Freq: Every day | ORAL | 3 refills | Status: DC

## 2022-01-30 NOTE — Telephone Encounter (Signed)
FORWARDED TO  RON SMITH< PAC> ? ?[facility] ?

## 2022-05-16 ENCOUNTER — Other Ambulatory Visit: Payer: Self-pay

## 2022-05-16 DIAGNOSIS — F411 Generalized anxiety disorder: Secondary | ICD-10-CM

## 2022-05-16 MED ORDER — SERTRALINE HCL 25 MG PO TABS: 25.0000 mg | ORAL_TABLET | Freq: Every day | ORAL | 3 refills | Status: DC

## 2022-09-27 ENCOUNTER — Telehealth: Payer: Self-pay

## 2022-09-27 NOTE — Telephone Encounter (Signed)
Reached out to patient to schedule annual physical. Pt is on vacation and will call back Tuesday, 10-01-22.

## 2022-10-07 ENCOUNTER — Ambulatory Visit: Payer: Self-pay

## 2022-10-07 DIAGNOSIS — Z0289 Encounter for other administrative examinations: Secondary | ICD-10-CM

## 2022-10-07 DIAGNOSIS — Z0000 Encounter for general adult medical examination without abnormal findings: Secondary | ICD-10-CM

## 2022-10-07 LAB — POCT URINALYSIS DIPSTICK
Bilirubin, UA: NEGATIVE
Blood, UA: NEGATIVE
Glucose, UA: NEGATIVE
Ketones, UA: NEGATIVE
Leukocytes, UA: NEGATIVE
Nitrite, UA: NEGATIVE
Protein, UA: NEGATIVE
Spec Grav, UA: >=1.03 — AB (ref 1.010–1.025)
Urobilinogen, UA: 0.2 E.U./dL
pH, UA: 6 (ref 5.0–8.0)

## 2022-10-07 NOTE — Progress Notes (Signed)
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  

## 2022-10-08 LAB — CMP12+LP+TP+TSH+6AC+CBC/D/PLT
ALT: 33 IU/L (ref 0–44)
AST: 29 IU/L (ref 0–40)
Albumin/Globulin Ratio: 2.4 — ABNORMAL HIGH (ref 1.2–2.2)
Albumin: 4.8 g/dL (ref 4.1–5.1)
Alkaline Phosphatase: 75 IU/L (ref 44–121)
BUN/Creatinine Ratio: 10 (ref 9–20)
BUN: 10 mg/dL (ref 6–20)
Basophils Absolute: 0 10*3/uL (ref 0.0–0.2)
Basos: 0 %
Bilirubin Total: 0.8 mg/dL (ref 0.0–1.2)
Calcium: 9.5 mg/dL (ref 8.7–10.2)
Chloride: 101 mmol/L (ref 96–106)
Chol/HDL Ratio: 6 ratio — ABNORMAL HIGH (ref 0.0–5.0)
Cholesterol, Total: 223 mg/dL — ABNORMAL HIGH (ref 100–199)
Creatinine, Ser: 0.98 mg/dL (ref 0.76–1.27)
EOS (ABSOLUTE): 0.1 10*3/uL (ref 0.0–0.4)
Eos: 1 %
Estimated CHD Risk: 1.3 times avg. — ABNORMAL HIGH (ref 0.0–1.0)
Free Thyroxine Index: 2 (ref 1.2–4.9)
GGT: 33 IU/L (ref 0–65)
Globulin, Total: 2 g/dL (ref 1.5–4.5)
Glucose: 96 mg/dL (ref 70–99)
HDL: 37 mg/dL — ABNORMAL LOW (ref >39)
Hematocrit: 45.6 % (ref 37.5–51.0)
Hemoglobin: 16.3 g/dL (ref 13.0–17.7)
Immature Grans (Abs): 0 10*3/uL (ref 0.0–0.1)
Immature Granulocytes: 0 %
Iron: 126 ug/dL (ref 38–169)
LDH: 161 IU/L (ref 121–224)
LDL Chol Calc (NIH): 155 mg/dL — ABNORMAL HIGH (ref 0–99)
Lymphocytes Absolute: 2.2 10*3/uL (ref 0.7–3.1)
Lymphs: 30 %
MCH: 32.7 pg (ref 26.6–33.0)
MCHC: 35.7 g/dL (ref 31.5–35.7)
MCV: 91 fL (ref 79–97)
Monocytes Absolute: 0.6 10*3/uL (ref 0.1–0.9)
Monocytes: 8 %
Neutrophils Absolute: 4.5 10*3/uL (ref 1.4–7.0)
Neutrophils: 61 %
Phosphorus: 3.8 mg/dL (ref 2.8–4.1)
Platelets: 300 10*3/uL (ref 150–450)
Potassium: 4 mmol/L (ref 3.5–5.2)
RBC: 4.99 x10E6/uL (ref 4.14–5.80)
RDW: 12.1 % (ref 11.6–15.4)
Sodium: 141 mmol/L (ref 134–144)
T3 Uptake Ratio: 26 % (ref 24–39)
T4, Total: 7.5 ug/dL (ref 4.5–12.0)
TSH: 1.73 u[IU]/mL (ref 0.450–4.500)
Total Protein: 6.8 g/dL (ref 6.0–8.5)
Triglycerides: 167 mg/dL — ABNORMAL HIGH (ref 0–149)
Uric Acid: 6.3 mg/dL (ref 3.8–8.4)
VLDL Cholesterol Cal: 31 mg/dL (ref 5–40)
WBC: 7.4 10*3/uL (ref 3.4–10.8)
eGFR: 104 mL/min/{1.73_m2} (ref >59)

## 2022-10-10 ENCOUNTER — Other Ambulatory Visit: Payer: Self-pay | Admitting: Physician Assistant

## 2022-10-10 ENCOUNTER — Ambulatory Visit: Payer: Self-pay | Admitting: Physician Assistant

## 2022-10-10 ENCOUNTER — Encounter: Payer: Self-pay | Admitting: Physician Assistant

## 2022-10-10 VITALS — BP 140/90 | HR 101 | Temp 98.9°F | Resp 14 | Ht 70.0 in | Wt 165.0 lb

## 2022-10-10 DIAGNOSIS — Z0289 Encounter for other administrative examinations: Secondary | ICD-10-CM

## 2022-10-10 DIAGNOSIS — F411 Generalized anxiety disorder: Secondary | ICD-10-CM

## 2022-10-10 MED ORDER — SERTRALINE HCL 25 MG PO TABS: 25.0000 mg | ORAL_TABLET | Freq: Every day | ORAL | 3 refills | Status: AC

## 2022-10-10 NOTE — Progress Notes (Signed)
[facility]     ____________________________________________   None    (approximate)  I have reviewed the triage vital signs and the nursing notes.   HISTORY  Chief Complaint No chief complaint on file.    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  Patient voiced no concerns or complaints.  Patient has documented case of "white coat syndrome".        Past Medical History:  Diagnosis Date   ADD (attention deficit disorder)    Anxiety    Dyslipidemia    Elevated blood pressure reading    Hemochromatosis carrier    History of enlargement of pituitary gland 11/29/2021    Patient Active Problem List   Diagnosis Date Noted   Encounter for physical examination related to employment 10/07/2022   History of enlargement of pituitary gland 11/29/2021    No past surgical history on file.  Prior to Admission medications   Medication Sig Start Date End Date Taking? Authorizing Provider  sertraline (ZOLOFT) 25 MG tablet Take 1 tablet (25 mg total) by mouth daily. 05/16/22   Sable Feil, PA-C    Allergies Patient has no known allergies.  Family History  Problem Relation Age of Onset   Cancer Father     Social History Social History   Tobacco Use   Smoking status: Never   Smokeless tobacco: Current    Types: Snuff    Review of Systems Constitutional: No fever/chills Eyes: No visual changes. ENT: No sore throat. Cardiovascular: Denies chest pain. Respiratory: Denies shortness of breath. Gastrointestinal: No abdominal pain.  No nausea, no vomiting.  No diarrhea.  No constipation. Genitourinary: Negative for dysuria. Musculoskeletal: Negative for back pain. Skin: Negative for rash. Neurological: Negative for headaches, focal weakness or numbness. Endocrine: Mixed hyperlipidemia Hematological/Lymphatic: Hemochromatosis  carrier   ____________________________________________   PHYSICAL EXAM:  VITAL SIGNS: BP is 140/90, pulse 101, respiration 14, temperature 98.9, patient 90% O2 sat on room air.  Patient with 165 pounds BMI is 23.69. Constitutional: Alert and oriented. Well appearing and in no acute distress. Eyes: Conjunctivae are normal. PERRL. EOMI. Head: Atraumatic. Nose: No congestion/rhinnorhea. Mouth/Throat: Mucous membranes are moist.  Oropharynx non-erythematous. Neck: No stridor.  No cervical spine tenderness to palpation. Hematological/Lymphatic/Immunilogical: No cervical lymphadenopathy. Cardiovascular: Normal rate, regular rhythm. Grossly normal heart sounds.  Good peripheral circulation. Respiratory: Normal respiratory effort.  No retractions. Lungs CTAB. Gastrointestinal: Soft and nontender. No distention. No abdominal bruits. No CVA tenderness. Genitourinary: Deferred Musculoskeletal: No lower extremity tenderness nor edema.  No joint effusions. Neurologic:  Normal speech and language. No gross focal neurologic deficits are appreciated. No gait instability. Skin:  Skin is warm, dry and intact. No rash noted. Psychiatric: Mood and affect are normal. Speech and behavior are normal.  ____________________________________________   LABS _        Component Ref Range & Units 3 d ago 9 mo ago 2 yr ago 3 yr ago  Color, UA  yellow amber yellow Yellow  Clarity, UA  clear clear clear Clear  Glucose, UA _0   Bilirubin, UA  negative negative negative Negative  Ketones, UA  negative negative negative Negative  Spec Grav, UA 1.010 - 1.025 >=1.030 Abnormal  >=1.030 Abnormal  1.010 1.020  Blood, UA  neg negative negative Negative  pH, UA 5.0 - 8.0 6.0 6.0 6.0 6.0  Protein, UA _1   Urobilinogen, UA 0.2 or 1.0  E.U./dL 0.2 0.2 0.2 0.2  Nitrite, UA  neg negative negative Negative  Leukocytes, UA Negative Negative  Negative Negative Negative  Appearance   dark light   Odor                        Component Ref Range & Units 3 d ago (10/07/22) 9 mo ago (01/01/22) 11 mo ago (11/14/21) 11 mo ago (11/14/21) 2 yr ago (10/04/20) 3 yr ago (09/09/19)  Glucose 70 - 99 mg/dL 96 74 100 High  CM  91 R 77 R  Uric Acid 3.8 - 8.4 mg/dL 6.3 7.5 CM   5.8 CM 6.3 R, CM  Comment:            Therapeutic target for gout patients: <6.0  BUN 6 - 20 mg/dL 10 11 8  12 10  Creatinine, Ser 0.76 - 1.27 mg/dL 0.98 1.07 0.92 R  0.99 1.05  eGFR >59 mL/min/1.73 104 94      BUN/Creatinine Ratio 9 - 20 10 10   12 10  Sodium 134 - 144 mmol/L 141 141 138 R  138 139  Potassium 3.5 - 5.2 mmol/L 4.0 4.2 4.4 R  4.3 4.6  Chloride 96 - 106 mmol/L 101 101 102 R  99 101  Calcium 8.7 - 10.2 mg/dL 9.5 9.5 9.3 R  9.5 9.4  Phosphorus 2.8 - 4.1 mg/dL 3.8 3.6   3.4 3.3  Total Protein 6.0 - 8.5 g/dL 6.8 7.3   7.1 7.2  Albumin 4.1 - 5.1 g/dL 4.8 4.9 R   5.0 R 4.7 R  Globulin, Total 1.5 - 4.5 g/dL 2.0 2.4   2.1 2.5  Albumin/Globulin Ratio 1.2 - 2.2 2.4 High  2.0   2.4 High  1.9  Bilirubin Total 0.0 - 1.2 mg/dL 0.8 0.6   0.9 0.3  Alkaline Phosphatase 44 - 121 IU/L 75 82   80 CM 94 R  LDH 121 - 224 IU/L 161 160   150 150  AST 0 - 40 IU/L 29 22   23 20  ALT 0 - 44 IU/L 33 21   18 18  GGT 0 - 65 IU/L 33 32   23 20  Iron 38 - 169 ug/dL 126 200 High    320 High Panic  91  Cholesterol, Total 100 - 199 mg/dL 223 High  223 High    223 High  215 High   Triglycerides 0 - 149 mg/dL 167 High  175 High    177 High  167 High   HDL >39 mg/dL 37 Low  40   38 Low  42  VLDL Cholesterol Cal 5 - 40 mg/dL 31 32   32 30  LDL Chol Calc (NIH) 0 - 99 mg/dL 155 High  151 High    153 High  143 High   Chol/HDL Ratio 0.0 - 5.0 ratio 6.0 High  5.6 High  CM   5.9 High  CM 5.1 High  CM  Comment:                                   T. Chol/HDL Ratio                                             Men  Women                                 1/2 Avg.Risk  3.4    3.3                                    Avg.Risk  5.0    4.4                                2X Avg.Risk  9.6    7.1                                3X Avg.Risk 23.4   11.0  Estimated CHD Risk 0.0 - 1.0 times avg. 1.3 High  1.2 High  CM   1.2 High  CM 1.1 High  CM  Comment: The CHD Risk is based on the T. Chol/HDL ratio. Other factors affect CHD Risk such as hypertension, smoking, diabetes, severe obesity, and family history of premature CHD.  TSH 0.450 - 4.500 uIU/mL 1.730 0.898   1.130 1.220  T4, Total 4.5 - 12.0 ug/dL 7.5 6.3   6.6 6.6  T3 Uptake Ratio 24 - 39 % 26 25   28 27  Free Thyroxine Index 1.2 - 4.9 2.0 1.6   1.8 1.8  WBC 3.4 - 10.8 x10E3/uL 7.4 5.7  8.9 R 7.1 7.7  RBC 4.14 - 5.80 x10E6/uL 4.99 5.54  5.15 R 5.12 5.22  Hemoglobin 13.0 - 17.7 g/dL 16.3 18.1 High   17.2 High  R 16.4 17.2  Hematocrit 37.5 - 51.0 % 45.6 50.9  47.1 R 46.8 49.5  MCV 79 - 97 fL 91 92  91.5 R 91 95  MCH 26.6 - 33.0 pg 32.7 32.7  33.4 R 32.0 33.0  MCHC 31.5 - 35.7 g/dL 35.7 35.6  36.5 High  R 35.0 34.7  RDW 11.6 - 15.4 % 12.1 11.6  11.3 Low  R 11.9 11.8  Platelets 150 - 450 x10E3/uL 300 331  360 R 300 307  Neutrophils Not Estab. % 61 57  63 R 55 62  Lymphs Not Estab. % 30 31   33 28  Monocytes Not Estab. % 8 9   9 8  Eos Not Estab. % 1 2   2 1  Basos Not Estab. % 0 1   1 1  Neutrophils Absolute 1.4 - 7.0 x10E3/uL 4.5 3.3  5.7 R 3.9 4.8  Lymphocytes Absolute 0.7 - 3.1 x10E3/uL 2.2 1.8  2.4 R 2.3 2.1  Monocytes Absolute 0.1 - 0.9 x10E3/uL 0.6 0.5   0.7 0.6  EOS (ABSOLUTE) 0.0 - 0.4 x10E3/uL 0.1 0.1   0.1 0.1  Basophils Absolute 0.0 - 0.2 x10E3/uL 0.0 0.0  0.0 R 0.0 0.0  Immature Granulocytes Not Estab. % 0 0  1 R 0 0               ___________________________________________  EKG  Normal sinus rhythm 84 bpm. A new finding of left atrial enlargement.  Asymptomatic. ____________________________________________   ____________________________________________   INITIAL IMPRESSION / ASSESSMENT AND PLAN  As part  of my medical decision making, I reviewed the following data within the electronic medical record:          Discussed lab results and EKG findings with patient.  Patient to continue diet exercise to lower lipids.     ____________________________________________   FINAL CLINICAL IMPRESSION  Well exam     ED Discharge Orders     None        Note:  This document was prepared using Dragon voice recognition software and may include unintentional dictation errors.  

## 2022-10-10 NOTE — Progress Notes (Signed)
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

## 2022-12-03 ENCOUNTER — Other Ambulatory Visit: Payer: Self-pay

## 2022-12-03 DIAGNOSIS — Z201 Contact with and (suspected) exposure to tuberculosis: Secondary | ICD-10-CM

## 2022-12-03 NOTE — Progress Notes (Signed)
Pt completed TB labs due to exposure. Will contact pt when results come in.

## 2022-12-06 LAB — QUANTIFERON-TB GOLD PLUS
QuantiFERON Mitogen Value: >10 IU/mL
QuantiFERON Nil Value: 0.05 IU/mL
QuantiFERON TB1 Ag Value: 0.08 IU/mL
QuantiFERON TB2 Ag Value: 0.05 IU/mL
QuantiFERON-TB Gold Plus: NEGATIVE
